# Patient Record
Sex: Female | Born: 1945
Health system: Southern US, Community
[De-identification: ages and names within clinical notes are randomized; demographics above are authoritative.]

## PROBLEM LIST (undated history)

## (undated) DIAGNOSIS — R519 Headache, unspecified: Secondary | ICD-10-CM

## (undated) DIAGNOSIS — M51369 Other intervertebral disc degeneration, lumbar region without mention of lumbar back pain or lower extremity pain: Secondary | ICD-10-CM

## (undated) DIAGNOSIS — Z531 Procedure and treatment not carried out because of patient's decision for reasons of belief and group pressure: Secondary | ICD-10-CM

## (undated) DIAGNOSIS — E039 Hypothyroidism, unspecified: Secondary | ICD-10-CM

## (undated) DIAGNOSIS — M1990 Unspecified osteoarthritis, unspecified site: Secondary | ICD-10-CM

## (undated) DIAGNOSIS — R51 Headache: Secondary | ICD-10-CM

## (undated) DIAGNOSIS — IMO0001 Reserved for inherently not codable concepts without codable children: Secondary | ICD-10-CM

## (undated) DIAGNOSIS — Z9889 Other specified postprocedural states: Secondary | ICD-10-CM

## (undated) DIAGNOSIS — I3139 Other pericardial effusion (noninflammatory): Secondary | ICD-10-CM

## (undated) DIAGNOSIS — I313 Pericardial effusion (noninflammatory): Secondary | ICD-10-CM

## (undated) DIAGNOSIS — M5136 Other intervertebral disc degeneration, lumbar region: Secondary | ICD-10-CM

## (undated) HISTORY — PX: REPAIR NONUNION / DEFECT OF RADIUS / ULNA: SUR1192

## (undated) HISTORY — DX: Other specified postprocedural states: Z98.890

## (undated) HISTORY — PX: DILATION AND CURETTAGE OF UTERUS: SHX78

---

## 2003-01-20 ENCOUNTER — Ambulatory Visit (HOSPITAL_COMMUNITY): Admission: RE | Admit: 2003-01-20 | Discharge: 2003-01-20 | Payer: Self-pay | Admitting: Oral and Maxillofacial Surgery

## 2005-01-27 ENCOUNTER — Ambulatory Visit: Payer: Self-pay

## 2005-11-10 ENCOUNTER — Ambulatory Visit: Payer: Self-pay | Admitting: Family Medicine

## 2006-01-23 ENCOUNTER — Ambulatory Visit: Payer: Self-pay

## 2006-08-17 ENCOUNTER — Ambulatory Visit: Payer: Self-pay

## 2007-09-09 ENCOUNTER — Ambulatory Visit: Payer: Self-pay

## 2009-04-23 ENCOUNTER — Ambulatory Visit: Payer: Self-pay | Admitting: Family Medicine

## 2009-04-23 DIAGNOSIS — R5381 Other malaise: Secondary | ICD-10-CM

## 2009-04-23 DIAGNOSIS — E039 Hypothyroidism, unspecified: Secondary | ICD-10-CM

## 2009-04-23 DIAGNOSIS — G43109 Migraine with aura, not intractable, without status migrainosus: Secondary | ICD-10-CM | POA: Insufficient documentation

## 2009-04-23 DIAGNOSIS — R5383 Other fatigue: Secondary | ICD-10-CM

## 2009-04-23 DIAGNOSIS — M810 Age-related osteoporosis without current pathological fracture: Secondary | ICD-10-CM | POA: Insufficient documentation

## 2009-04-23 DIAGNOSIS — M8580 Other specified disorders of bone density and structure, unspecified site: Secondary | ICD-10-CM

## 2009-04-23 DIAGNOSIS — M1990 Unspecified osteoarthritis, unspecified site: Secondary | ICD-10-CM | POA: Insufficient documentation

## 2009-04-26 LAB — CONVERTED CEMR LAB: TSH: 1.75 microintl units/mL (ref 0.35–5.50)

## 2009-05-28 ENCOUNTER — Ambulatory Visit: Payer: Self-pay | Admitting: Family Medicine

## 2009-05-31 LAB — CONVERTED CEMR LAB
ALT: 14 units/L (ref 0–35)
AST: 17 units/L (ref 0–37)
Albumin: 4 g/dL (ref 3.5–5.2)
Alkaline Phosphatase: 38 units/L — ABNORMAL LOW (ref 39–117)
BUN: 14 mg/dL (ref 6–23)
Basophils Absolute: 0 10*3/uL (ref 0.0–0.1)
Basophils Relative: 0.6 % (ref 0.0–3.0)
Bilirubin, Direct: 0.1 mg/dL (ref 0.0–0.3)
CO2: 29 meq/L (ref 19–32)
Calcium: 8.9 mg/dL (ref 8.4–10.5)
Chloride: 110 meq/L (ref 96–112)
Cholesterol: 194 mg/dL (ref 0–200)
Creatinine, Ser: 0.9 mg/dL (ref 0.4–1.2)
Eosinophils Absolute: 0.2 10*3/uL (ref 0.0–0.7)
Eosinophils Relative: 3.9 % (ref 0.0–5.0)
GFR calc non Af Amer: 67.41 mL/min (ref >60)
Glucose, Bld: 87 mg/dL (ref 70–99)
HCT: 38.9 % (ref 36.0–46.0)
HDL: 72.1 mg/dL (ref >39.00)
Hemoglobin: 13.1 g/dL (ref 12.0–15.0)
LDL Cholesterol: 110 mg/dL — ABNORMAL HIGH (ref 0–99)
Lymphocytes Relative: 34.2 % (ref 12.0–46.0)
Lymphs Abs: 1.7 10*3/uL (ref 0.7–4.0)
MCHC: 33.5 g/dL (ref 30.0–36.0)
MCV: 92.2 fL (ref 78.0–100.0)
Monocytes Absolute: 0.4 10*3/uL (ref 0.1–1.0)
Monocytes Relative: 8.3 % (ref 3.0–12.0)
Neutro Abs: 2.7 10*3/uL (ref 1.4–7.7)
Neutrophils Relative %: 53 % (ref 43.0–77.0)
Platelets: 251 10*3/uL (ref 150.0–400.0)
Potassium: 3.8 meq/L (ref 3.5–5.1)
RBC: 4.22 M/uL (ref 3.87–5.11)
RDW: 11.9 % (ref 11.5–14.6)
Sodium: 141 meq/L (ref 135–145)
Total Bilirubin: 0.6 mg/dL (ref 0.3–1.2)
Total CHOL/HDL Ratio: 3
Total Protein: 6.7 g/dL (ref 6.0–8.3)
Triglycerides: 62 mg/dL (ref 0.0–149.0)
VLDL: 12.4 mg/dL (ref 0.0–40.0)
Vit D, 25-Hydroxy: 34 ng/mL (ref 30–89)
Vitamin B-12: 328 pg/mL (ref 211–911)
WBC: 5 10*3/uL (ref 4.5–10.5)

## 2009-06-01 ENCOUNTER — Other Ambulatory Visit: Admission: RE | Admit: 2009-06-01 | Discharge: 2009-06-01 | Payer: Self-pay | Admitting: Family Medicine

## 2009-06-01 ENCOUNTER — Ambulatory Visit: Payer: Self-pay | Admitting: Family Medicine

## 2009-06-01 LAB — HM PAP SMEAR

## 2009-06-01 LAB — CONVERTED CEMR LAB: Pap Smear: NORMAL

## 2009-06-02 ENCOUNTER — Encounter: Payer: Self-pay | Admitting: Family Medicine

## 2009-06-02 ENCOUNTER — Ambulatory Visit: Payer: Self-pay | Admitting: Family Medicine

## 2009-06-03 ENCOUNTER — Ambulatory Visit: Payer: Self-pay | Admitting: Family Medicine

## 2009-06-03 ENCOUNTER — Encounter: Payer: Self-pay | Admitting: Family Medicine

## 2009-06-04 ENCOUNTER — Encounter: Payer: Self-pay | Admitting: Family Medicine

## 2009-06-04 LAB — CONVERTED CEMR LAB: Pap Smear: NEGATIVE

## 2009-06-08 LAB — HM MAMMOGRAPHY: HM Mammogram: NORMAL

## 2009-06-09 ENCOUNTER — Encounter (INDEPENDENT_AMBULATORY_CARE_PROVIDER_SITE_OTHER): Payer: Self-pay | Admitting: *Deleted

## 2009-09-09 ENCOUNTER — Encounter (INDEPENDENT_AMBULATORY_CARE_PROVIDER_SITE_OTHER): Payer: Self-pay | Admitting: *Deleted

## 2009-09-14 ENCOUNTER — Telehealth: Payer: Self-pay | Admitting: Family Medicine

## 2009-09-15 ENCOUNTER — Telehealth: Payer: Self-pay | Admitting: Family Medicine

## 2010-04-21 NOTE — Progress Notes (Signed)
Summary: No response to GI referral.  Phone Note Other Incoming   Summary of Call: Pt unresponsive to call and Ltr regarding GI referral...fyi to Phyician.Daine Gip  September 14, 2009 12:08 PM  Initial call taken by: Daine Gip,  September 14, 2009 12:08 PM

## 2010-04-21 NOTE — Letter (Signed)
Summary: Results Follow up Letter  [place] at [facility]  [address] [place], [place] [address]   Phone: [phone]  Fax: [phone]    06/09/2009 MRN: [id number]     [facility] [address] [place], [place]  [address]    Dear Ms. Pasion,  The following are the results of your recent test(s):  Test         Result    Pap Smear:        Normal _____  Not Normal _____ Comments: ______________________________________________________ Cholesterol: LDL(Bad cholesterol):         Your goal is less than:         HDL (Good cholesterol):       Your goal is more than: Comments:  ______________________________________________________ Mammogram:        Normal ___x__  Not Normal _____ Comments: repeat in 1 year  ___________________________________________________________________ Hemoccult:        Normal _____  Not normal _______ Comments:    _____________________________________________________________________ Other Tests:    We routinely do not discuss normal results over the telephone.  If you desire a copy of the results, or you have any questions about this information we can discuss them at your next office visit.   Sincerely,

## 2010-04-21 NOTE — Assessment & Plan Note (Signed)
Summary: PT TO RE-ESTABLISH,CHECK THYROID   Vital Signs:  Patient profile:   65 year old female Weight:      164.13 pounds Temp:     98.7 degrees F oral Pulse rate:   80 / minute Pulse rhythm:   regular BP sitting:   112 / 82  (left arm) Cuff size:   regular  Vitals Entered By: Linde Gillis CMA Duncan Dull) (April 23, 2009 10:07 AM) CC: new patient/restablish care   History of Present Illness: Has not seen MD in 3 years..no specific issues.   Works for an endocrinologist ...they have been checking this.Marland Kitchendue for refills.   Feels in last 3-4 week..very tired at end of day.  Not really well rested in AM.  No AM headaches. No snoring. Urinates 2 times at night..not really adequate sleep.  Urinates every few hours during the day. Drinks water all night!   Allergies (verified): 1)  ! * Wool 2)  Sulfa  Past History:  Past Surgical History: D and C Right rad/ulna repair  Family History: Father deceased at age 72 from flu, pneumonia, and sepsis. Mother deceased at age 74 with CVA, MI, and ? Parkinson disease.  She has 2 brothers with 1 who passed away in the past several years with small cell lung cancer, and she also has 2 sisters who are healthy.  There is a strong family history of coronary artery disease, high blood pressure, and her grandparents had diabetes.  There is also breast cancer in her maternal grandmother and aunt. Niece with bipolar disorder.  She denies other types of cancer.   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  She is divorced and is not currently sexually active. She does have 3 kids and 7 grandchildren.  She does not get any regular exercise.  She does skip breakfast daily and eats a small amount with frequent snacks.  She drinks lots of water and eats a large amount of fruits and vegetables.  She was a former smoker with a 6-pack year history but quitover 17 years ago.  She drinks about 4 drinks every  3-4 months.  She denies any past use of any illegal drugs.   Review of Systems General:  Complains of fatigue; denies fever. CV:  Denies chest pain or discomfort. Resp:  Denies shortness of breath. GI:  Denies abdominal pain, bloody stools, constipation, and diarrhea. GU:  Denies dysuria.  Physical Exam  General:  Well-developed,well-nourished,in no acute distress; alert,appropriate and cooperative throughout examination Eyes:  No corneal or conjunctival inflammation noted. EOMI. Perrla. Funduscopic exam benign, without hemorrhages, exudates or papilledema. Vision grossly normal. Ears:  External ear exam shows no significant lesions or deformities.  Otoscopic examination reveals clear canals, tympanic membranes are intact bilaterally without bulging, retraction, inflammation or discharge. Hearing is grossly normal bilaterally. Nose:  External nasal examination shows no deformity or inflammation. Nasal mucosa are pink and moist without lesions or exudates. Mouth:  MMM Neck:  No deformities, masses, or tenderness noted. no cervical or supraclavicular lymphadenopathy  Lungs:  Normal respiratory effort, chest expands symmetrically. Lungs are clear to auscultation, no crackles or wheezes. Heart:  Normal rate and regular rhythm. S1 and S2 normal without gallop, murmur, click, rub or other extra sounds. Abdomen:  Bowel sounds positive,abdomen soft and non-tender without masses, organomegaly or hernias noted. Msk:  No deformity or scoliosis noted of thoracic or lumbar spine.   Pulses:  R and L posterior tibial pulses are full  and equal bilaterally  Extremities:  no edema  Neurologic:  No cranial nerve deficits noted. Station and gait are normal.  Sensory, motor and coordinative functions appear intact. Skin:  Intact without suspicious lesions or rashes Psych:  Cognition and judgment appear intact. Alert and cooperative with normal attention span and concentration. No apparent delusions, illusions,  hallucinations   Impression & Recommendations:  Problem # 1:  UNSPECIFIED HYPOTHYROIDISM (ICD-244.9) Due for reeval. Hold refill until labs back. Her updated medication list for this problem includes:    Levothyroxine Sodium 125 Mcg Tabs (Levothyroxine sodium) .Marland Kitchen... Take one tablet by mouth once daily  Orders: TLB-TSH (Thyroid Stimulating Hormone) (84443-TSH)  Problem # 2:  FATIGUE, CHRONIC (ICD-780.79) Will eval with labs for vit def, other cause. ?due to frequant urination and poor sleep. Gave info on sleep hygeine. Will follow up at upcoming CPX.   Complete Medication List: 1)  Levothyroxine Sodium 125 Mcg Tabs (Levothyroxine sodium) .... Take one tablet by mouth once daily  Patient Instructions: 1)  Fasting  lipids, CMET Dx v75.91, cbc, vit B12, vit D Dx 789.90 2)  Schedule CPX in next  month.  Current Allergies (reviewed today): ! * WOOL SULFA  Flu Vaccine Result Date:  01/18/2009 Flu Vaccine Result:  given Flu Vaccine Next Due:  1 yr Flex Sig Next Due:  Not Indicated    Past Surgical History:    D and C    Right rad/ulna repair

## 2010-04-21 NOTE — Miscellaneous (Signed)
Summary: Vaccine Record/[facility]  Vaccine Record/[facility]   Imported By: Lanelle Bal 06/02/2009 13:45:01  _____________________________________________________________________  External Attachment:    Type:   Image     Comment:   External Document  Appended Document: Orders Update    Clinical Lists Changes  Observations: Added new observation of TDBOOSTDUE: 12/03/2017 (06/02/2009 14:08) Added new observation of TD BOOSTER: given (12/04/2007 14:32) Added new observation of TD BOOSTER: given (12/04/2007 14:08)      TD Result Date:  12/04/2007 TD Result:  given TD Next Due:  10 yr Notify pt vaccine records reviewed..up to date with tetanus..due in 2019 for repeat. Kerby Nora MD  June 02, 2009 2:09 PM  Patient advised.Consuello Masse CMA

## 2010-04-21 NOTE — Letter (Signed)
Summary: Results Follow up Letter  [place] at [facility]  [address] [place], [place] [address]   Phone: [phone]  Fax: [phone]    06/04/2009 MRN: [id number]  [facility] [address] [place], [place]  [address]  Dear Ms. Carel,  The following are the results of your recent test(s):  Test         Result    Pap Smear:        Normal __X___  Not Normal _____ Comments: Please repeat in one year. ______________________________________________________ Cholesterol: LDL(Bad cholesterol):         Your goal is less than:         HDL (Good cholesterol):       Your goal is more than: Comments:  ______________________________________________________ Mammogram:        Normal _____  Not Normal _____ Comments:  ___________________________________________________________________ Hemoccult:        Normal _____  Not normal _______ Comments:    _____________________________________________________________________ Other Tests:    We routinely do not discuss normal results over the telephone.  If you desire a copy of the results, or you have any questions about this information we can discuss them at your next office visit.   Sincerely,       Kerby Nora, MD

## 2010-04-21 NOTE — Progress Notes (Signed)
Summary: wants weight loss pill  Phone Note Call from Patient Call back at Home Phone [phone]   Caller: Patient Call For: Kerby Nora MD Summary of Call: Pt says she is on the state health plan and her insurance plan charges her extra for being over weight.  She has been trying to lose weight but still has about 25-30 lbs to lose.  She is asking for a weight loss pill to help her with this.  Uses [facility] [address]. Initial call taken by: Lowella Petties CMA,  September 15, 2009 3:49 PM  Follow-up for Phone Call        She can use OTC ALLI for weight loss. I do not prescribe stimulant medication due to cardiac side effects and temporary results. If she wants second opinion she may go to weight loss center in area for stimulant medication.  Follow-up by: Kerby Nora MD,  September 17, 2009 1:51 PM  Additional Follow-up for Phone Call Additional follow up Details #1::        patient advised.Consuello Masse CMA   Additional Follow-up by: Benny Lennert CMA Duncan Dull),  September 17, 2009 2:05 PM

## 2010-04-21 NOTE — Letter (Signed)
Summary: Unable To Reach-Consult Scheduled  [place] at [facility]  [address] [place], [place] [address]   Phone: [phone]  Fax: [phone]    09/09/2009 MRN: [id number]    Dear Ms. Sapien,   We have been unable to reach you by phone.  Please contact our office with an updated phone number.  At the recommendation of Dr.Amy Bedsole, we have been asked to schedule you a consult with Gastroenterology. If you wish to decline this appointment, please call our office at [phone], and let us know.  If you have any question please call us.     Thank you, Aram Beecham  [phone]  Patient Care Coordinator [place] at [facility]

## 2010-04-21 NOTE — Assessment & Plan Note (Signed)
Summary: CPX PER MD/[facility]   Vital Signs:  Patient profile:   65 year old female Height:      66 inches Weight:      165.6 pounds BMI:     26.83 Temp:     98.0 degrees F oral Pulse rate:   80 / minute Pulse rhythm:   regular BP sitting:   120 / 70  (left arm) Cuff size:   regular  Vitals Entered By: Benny Lennert CMA Duncan Dull) (June 01, 2009 4:02 PM)  History of Present Illness: Chief complaint cpx   The patient is here for annual wellness exam and preventative care.      LAst bone density done in 1996.Marland Kitchenosteoporosis.   Problems Prior to Update: 1)  Other Osteoporosis  (ICD-733.09) 2)  Osteoarthritis, Moderate  (ICD-715.90) 3)  Fatigue, Chronic  (ICD-780.79) 4)  Migraine With Aura  (ICD-346.00) 5)  Unspecified Hypothyroidism  (ICD-244.9)  Current Medications (verified): 1)  Levothyroxine Sodium 125 Mcg Tabs (Levothyroxine Sodium) .... Take One Tablet By Mouth Once Daily  Allergies: 1)  ! * Wool 2)  Sulfa  Past History:  Past medical, surgical, family and social histories (including risk factors) reviewed, and no changes noted (except as noted below).  Past Surgical History: Reviewed history from 04/23/2009 and no changes required. D and C Right rad/ulna repair  Family History: Reviewed history from 04/23/2009 and no changes required. Father deceased at age 100 from flu, pneumonia, and sepsis. Mother deceased at age 66 with CVA, MI age 45s, and ? Parkinson disease.  She has 2 brothers with 1 who passed away in the past several years with small cell lung cancer, and she also has 2 sisters who are healthy.  There is a strong family history of coronary artery disease, high blood pressure, and her grandparents had diabetes.  There is also breast cancer in her maternal grandmother and aunt. Niece with bipolar disorder.  She denies other types of cancer.   Social History: Reviewed history from 04/23/2009 and no changes required. 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  She is divorced and is not currently sexually active. She does have 3 kids and 7 grandchildren.  She does not get any regular exercise.  She does skip breakfast daily and eats a small amount with frequent snacks.  She drinks lots of water and eats a large amount of fruits and vegetables.  She was a former smoker with a 6-pack year history but quitover 17 years ago.  She drinks about 4 drinks every 3-4 months.  She denies any past use of any illegal drugs.   Review of Systems General:  Complains of fatigue; denies fever. CV:  Denies chest pain or discomfort. Resp:  Denies shortness of breath. GI:  Denies abdominal pain. GU:  Denies abnormal vaginal bleeding, dysuria, nocturia, urinary frequency, and urinary hesitancy. Psych:  Denies anxiety and depression; sleep is improved.  Physical Exam  General:  Well-developed,well-nourished,in no acute distress; alert,appropriate and cooperative throughout examination Eyes:  No corneal or conjunctival inflammation noted. EOMI. Perrla. Funduscopic exam benign, without hemorrhages, exudates or papilledema. Vision grossly normal. Ears:  External ear exam shows no significant lesions or deformities.  Otoscopic examination reveals clear canals, tympanic membranes are intact bilaterally without bulging, retraction, inflammation or discharge. Hearing is grossly normal bilaterally. Nose:  External nasal examination shows no deformity or inflammation. Nasal mucosa are pink and moist without lesions or exudates. Mouth:  Oral mucosa and oropharynx without lesions or exudates.  Teeth in good repair. Neck:  no carotid bruit or thyromegaly no cervical or supraclavicular lymphadenopathy   Chest Wall:  No deformities, masses, or tenderness noted. Breasts:  No mass, nodules, thickening, tenderness, bulging, retraction, inflamation, nipple discharge or skin changes noted.   Lungs:  Normal respiratory effort, chest expands symmetrically.  Lungs are clear to auscultation, no crackles or wheezes. Heart:  Normal rate and regular rhythm. S1 and S2 normal without gallop, murmur, click, rub or other extra sounds. Abdomen:  Bowel sounds positive,abdomen soft and non-tender without masses, organomegaly or hernias noted. Genitalia:  Pelvic Exam:        External: normal female genitalia without lesions or masses        Vagina: normal without lesions or masses        Cervix: normal without lesions or masses        Adnexa: normal bimanual exam without masses or fullness        Uterus: normal by palpation        Pap smear: performed Msk:  No deformity or scoliosis noted of thoracic or lumbar spine.   Pulses:  R and L posterior tibial pulses are full and equal bilaterally  Extremities:  no edema  Neurologic:  No cranial nerve deficits noted. Station and gait are normal.  Sensory, motor and coordinative functions appear intact. Skin:  Intact without suspicious lesions or rashes Psych:  Cognition and judgment appear intact. Alert and cooperative with normal attention span and concentration. No apparent delusions, illusions, hallucinations   Impression & Recommendations:  Problem # 1:  Preventive Health Care (ICD-V70.0) The patient's preventative maintenance and recommended screening tests for an annual wellness exam were reviewed in full today. Brought up to date unless services declined.  Counselled on the importance of diet, exercise, and its role in overall health and mortality. The patient's FH and SH was reviewed, including their home life, tobacco status, and drug and alcohol status.     Problem # 2:  FATIGUE, CHRONIC (ICD-780.79) Eval with labs negative. Recommend better sleep hygiene, regualr exercise. Follow up if not imrpoving.   Complete Medication List: 1)  Levothyroxine Sodium 125 Mcg Tabs (Levothyroxine sodium) .... Take one tablet by mouth once daily  Other Orders: Radiology Referral (Radiology) Radiology Referral  (Radiology) Gastroenterology Referral (GI)  Patient Instructions: 1)  B12 1000 mg daily 2)  Calcium and vit D 600mg /400IU two times a day  3)  Referral Appointment Information 4)  Day/Date: 5)  Time: 6)  Place/MD: 7)  Address: 8)  Phone/Fax: 9)  Patient given appointment information. Information/Orders faxed/mailed.  10)  Call insurance about shingles vaccine.  11)  Start regular exercsie program.  12)  Please schedule a follow-up appointment in 1 month earlier if fatigue not improving.   Current Allergies (reviewed today): ! * WOOL SULFA   Past Surgical History:    Reviewed history from 04/23/2009 and no changes required:       D and C       Right rad/ulna repair

## 2010-05-14 ENCOUNTER — Encounter: Payer: Self-pay | Admitting: Family Medicine

## 2010-05-24 ENCOUNTER — Encounter: Payer: Self-pay | Admitting: Family Medicine

## 2010-06-10 ENCOUNTER — Telehealth: Payer: Self-pay | Admitting: Family Medicine

## 2010-06-10 DIAGNOSIS — Z13220 Encounter for screening for lipoid disorders: Secondary | ICD-10-CM | POA: Insufficient documentation

## 2010-06-10 DIAGNOSIS — M899 Disorder of bone, unspecified: Secondary | ICD-10-CM

## 2010-06-10 DIAGNOSIS — E039 Hypothyroidism, unspecified: Secondary | ICD-10-CM

## 2010-06-10 NOTE — Telephone Encounter (Signed)
Message copied by Kerby Nora on Fri Jun 10, 2010  5:03 PM ------      Message from: Margarite Gouge, NATASHA      Created: Fri Jun 10, 2010 11:01 AM      Regarding: CPX labs Mon       Please order  future cpx labs for pt's upcomming lab appt.            Thanks      Rodney Booze

## 2010-06-13 ENCOUNTER — Other Ambulatory Visit (INDEPENDENT_AMBULATORY_CARE_PROVIDER_SITE_OTHER): Payer: BC Managed Care – PPO | Admitting: Family Medicine

## 2010-06-13 DIAGNOSIS — M899 Disorder of bone, unspecified: Secondary | ICD-10-CM

## 2010-06-13 DIAGNOSIS — E785 Hyperlipidemia, unspecified: Secondary | ICD-10-CM

## 2010-06-13 DIAGNOSIS — M949 Disorder of cartilage, unspecified: Secondary | ICD-10-CM

## 2010-06-13 DIAGNOSIS — Z13220 Encounter for screening for lipoid disorders: Secondary | ICD-10-CM

## 2010-06-13 DIAGNOSIS — E039 Hypothyroidism, unspecified: Secondary | ICD-10-CM

## 2010-06-13 LAB — COMPREHENSIVE METABOLIC PANEL
ALT: 18 U/L (ref 0–35)
AST: 20 U/L (ref 0–37)
Albumin: 3.6 g/dL (ref 3.5–5.2)
Alkaline Phosphatase: 48 U/L (ref 39–117)
BUN: 17 mg/dL (ref 6–23)
CO2: 28 mEq/L (ref 19–32)
Calcium: 8.8 mg/dL (ref 8.4–10.5)
Chloride: 107 mEq/L (ref 96–112)
Creatinine, Ser: 1 mg/dL (ref 0.4–1.2)
GFR: 62.36 mL/min (ref >60.00)
Glucose, Bld: 59 mg/dL — ABNORMAL LOW (ref 70–99)
Potassium: 3.8 mEq/L (ref 3.5–5.1)
Sodium: 140 mEq/L (ref 135–145)
Total Bilirubin: 0.3 mg/dL (ref 0.3–1.2)
Total Protein: 6.2 g/dL (ref 6.0–8.3)

## 2010-06-13 LAB — LDL CHOLESTEROL, DIRECT: Direct LDL: 153.4 mg/dL

## 2010-06-13 LAB — LIPID PANEL
Cholesterol: 224 mg/dL — ABNORMAL HIGH (ref 0–200)
HDL: 58.8 mg/dL (ref >39.00)
Total CHOL/HDL Ratio: 4
Triglycerides: 143 mg/dL (ref 0.0–149.0)
VLDL: 28.6 mg/dL (ref 0.0–40.0)

## 2010-06-13 LAB — TSH: TSH: 1.09 u[IU]/mL (ref 0.35–5.50)

## 2010-06-14 LAB — VITAMIN D 25 HYDROXY (VIT D DEFICIENCY, FRACTURES): Vit D, 25-Hydroxy: 31 ng/mL (ref 30–89)

## 2010-06-15 NOTE — Progress Notes (Signed)
Patient advised via message on machine 

## 2010-06-20 ENCOUNTER — Encounter: Payer: Self-pay | Admitting: Family Medicine

## 2010-06-27 ENCOUNTER — Encounter: Payer: Self-pay | Admitting: Family Medicine

## 2010-07-11 ENCOUNTER — Encounter: Payer: Self-pay | Admitting: Family Medicine

## 2010-07-11 ENCOUNTER — Ambulatory Visit (INDEPENDENT_AMBULATORY_CARE_PROVIDER_SITE_OTHER): Payer: BC Managed Care – PPO | Admitting: Family Medicine

## 2010-07-11 ENCOUNTER — Ambulatory Visit: Payer: Self-pay | Admitting: Family Medicine

## 2010-07-11 DIAGNOSIS — M899 Disorder of bone, unspecified: Secondary | ICD-10-CM

## 2010-07-11 DIAGNOSIS — Z0000 Encounter for general adult medical examination without abnormal findings: Secondary | ICD-10-CM

## 2010-07-11 DIAGNOSIS — Z1211 Encounter for screening for malignant neoplasm of colon: Secondary | ICD-10-CM

## 2010-07-11 DIAGNOSIS — E039 Hypothyroidism, unspecified: Secondary | ICD-10-CM

## 2010-07-11 DIAGNOSIS — M949 Disorder of cartilage, unspecified: Secondary | ICD-10-CM

## 2010-07-11 DIAGNOSIS — E7800 Pure hypercholesterolemia, unspecified: Secondary | ICD-10-CM

## 2010-07-11 DIAGNOSIS — Z1231 Encounter for screening mammogram for malignant neoplasm of breast: Secondary | ICD-10-CM

## 2010-07-11 NOTE — Assessment & Plan Note (Signed)
LAST DXA 1 year ago. Repeat in 1 year.  On ca and vit D, vit D in nml range. Working on [employer] bearing exercise.

## 2010-07-11 NOTE — Assessment & Plan Note (Addendum)
LDL worsened control. Info given about lifestyle and diet  changes. Encouraged exercise, weight loss, healthy eating habits.  Recheck in 1 year.

## 2010-07-11 NOTE — Assessment & Plan Note (Signed)
Well controlled. Continue current medication.  

## 2010-07-11 NOTE — Progress Notes (Signed)
Subjective:    Patient ID: Stephanie Kemp, female    DOB: 11-Jan-1946, 65 y.o.   MRN: [id number]  HPI 65 year old female here for annual wellness exam and preventative care.    She has the following acute and/or chronic issues.  Hypothyroid: stable TSH on current dose of meds.  Screening labs reviewed in detail.. No high chol or DM. Vit D in nml range.  Some continued fatigue.. Work up negative. Exercising: walking daily 30-1 hour. Diet: fruits and veggies.Marland Kitchenworking on weight loss. Has gained 15 lbs in last few years...plans to go back to weight watchers.     Review of Systems  Constitutional: Positive for fatigue. Negative for fever and unexpected weight change.  HENT: Negative for ear pain, congestion, sore throat, sneezing, trouble swallowing and sinus pressure.   Eyes: Negative for pain and itching.  Respiratory: Negative for cough, shortness of breath and wheezing.   Cardiovascular: Negative for chest pain, palpitations and leg swelling.  Gastrointestinal: Negative for nausea, abdominal pain, diarrhea, constipation and blood in stool.  Genitourinary: Negative for dysuria, hematuria, vaginal discharge, difficulty urinating and menstrual problem.  Skin: Negative for rash.  Neurological: Negative for syncope, weakness, light-headedness, numbness and headaches.  Psychiatric/Behavioral: Negative for confusion and dysphoric mood. The patient is not nervous/anxious.        Objective:   Physical Exam  Constitutional: Vital signs are normal. She appears well-developed and well-nourished. She is cooperative.  Non-toxic appearance. She does not appear ill. No distress.  HENT:  Head: Normocephalic.  Right Ear: Hearing, tympanic membrane, external ear and ear canal normal.  Left Ear: Hearing, tympanic membrane, external ear and ear canal normal.  Nose: Nose normal.  Eyes: Conjunctivae, EOM and lids are normal. Pupils are equal, round, and reactive to light. No foreign bodies found.   Neck: Trachea normal and normal range of motion. Neck supple. Carotid bruit is not present. No mass and no thyromegaly present.  Cardiovascular: Normal rate, regular rhythm, S1 normal, S2 normal, normal heart sounds and intact distal pulses.  Exam reveals no gallop.   No murmur heard. Pulmonary/Chest: Effort normal and breath sounds normal. No respiratory distress. She has no wheezes. She has no rhonchi. She has no rales.  Abdominal: Soft. Normal appearance and bowel sounds are normal. She exhibits no distension, no fluid wave, no abdominal bruit and no mass. There is no hepatosplenomegaly. There is no tenderness. There is no rebound, no guarding and no CVA tenderness. No hernia.  Genitourinary: Rectum normal, vagina normal and uterus normal. No breast swelling, tenderness, discharge or bleeding. Pelvic exam was performed with patient prone. There is no rash, tenderness or lesion on the right labia. There is no rash, tenderness or lesion on the left labia. Uterus is not enlarged and not tender. Right adnexum displays no mass, no tenderness and no fullness. Left adnexum displays no mass, no tenderness and no fullness.       No pap performed  Lymphadenopathy:    She has no cervical adenopathy.    She has no axillary adenopathy.  Neurological: She is alert. She has normal strength. No cranial nerve deficit or sensory deficit.  Skin: Skin is warm, dry and intact. No rash noted.  Psychiatric: Her speech is normal and behavior is normal. Judgment normal. Her mood appears not anxious. Cognition and memory are normal. She does not exhibit a depressed mood.          Assessment & Plan:  Complete Physical Exam: The patient's preventative maintenance  and recommended screening tests for an annual wellness exam were reviewed in full today. Brought up to date unless services declined.  Counselled on the importance of diet, exercise, and its role in overall health and mortality. The patient's FH and SH  was reviewed, including their home life, tobacco status, and drug and alcohol status.

## 2010-07-11 NOTE — Patient Instructions (Addendum)
Look into shingles vaccine. Stop at front desk to speak with Shirlee Limerick about colonoscopy and mammogram. Work on low cholesterol diet and weight loss. Increase exercise.

## 2010-08-05 NOTE — Assessment & Plan Note (Signed)
[facility]                             [facility] OFFICE NOTE   NAME:Stephanie Kemp                      MRN:          [id number]  DATE:11/10/2005                            DOB:          November 04, 1945    CHIEF COMPLAINT:  A 65 year old white female here to establish a new doctor.   HISTORY OF PRESENT ILLNESS:  [name] has been having problems over the  past several years with fatigue.  The fatigue had gradual onset.  She feels  that ever since she was diagnosed with hypothyroidism she has had problems.  She was diagnosed in 1996.  She is very frustrated with the fatigue and is  unable to do things that she finds fun.  She denies any insomnia, slow  thinking, excessive guilt, suicidal ideation, or depressed mood.  She is  very concerned though about weight gain over the past few years.  She states  that she is very hungry throughout the day.  She denies any unexpected  weight loss, night sweats, fever, chills.  She does have some stress from  the fact that her son is in [place] and that causes her occasional anxiety  but this is not too much of a problem for her.   PAST MEDICAL HISTORY:  1. Hypothyroidism.  2. History of pericardial effusion.  3. Migraines without aura.  4. Osteoarthritis.  5. Osteoporosis.   HOSPITALIZATION SURGERIES/PROCEDURES:  1. In 2003, echocardiogram.  2. In 2000, Dexascan.  3. In 2001, normal mammogram.  4. In 2001, normal Pap smear.   ALLERGIES:  SULFA CAUSES RASH.   MEDICATIONS:  1. Levothyroxine 125 mcg daily.  2. Iron over-the-counter.  3. Glucosamine chondroitin.  4. Potassium 100 mEq p.o. daily.  5. Calcium and vitamin D 1500 mg to 2000 mg daily.  6. __________  470 mg daily.  7. Ex-Lax daily.   REVIEW OF SYSTEMS:  She denies dyspnea, chest pain, palpitations, cough,  nausea, vomiting, or diarrhea.  She does have constipation and requires Ex-  Lax daily.  She denies any joint pain and aches that  have not improved with  glucosamine.   FAMILY HISTORY:  Father deceased at age 53 from flu, pneumonia, and sepsis.  Mother deceased at age 59 with CVA, MI, and Parkinson disease.  She has 2  brothers with 1 who passed away in the past several years with small cell  lung cancer, and she also has 2 sisters who are healthy.  There is a strong  family history of coronary artery disease, high blood pressure, and her  grandparents had diabetes.  There is also breast cancer in her maternal  grandmother and aunt and a niece with bipolar disorder.  She denies other  types of cancer.   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  She is divorced and is not currently sexually active.  She does have 3 kids and 7 grandchildren.  She does not get any regular  exercise.  She does skip breakfast daily and eats a small amount with  frequent snacks.  She drinks lots of water and eats a large amount of fruits  and vegetables.  She was a former smoker with a 6-pack year history but quit  over 17 years ago.  She drinks about 4 drinks every 3-4 months.  She denies  any past use of any illegal drugs.   PHYSICAL EXAMINATION:  VITAL SIGNS:  Height 65-1/4 inches, weight 85.6,  making BMI 29, blood pressure 108/86, pulse 80, temperature 98.1.  GENERAL:  Over weight appearing female in no apparent distress.  HEENT:  PERRLA.  Extraocular muscles intact.  No papilledema.  Oropharynx  clear.  Tympanic membranes clear.  No thyromegaly.  No lymphadenopathy  cervical or supraclavicular.  PULMONARY:  Clear to auscultation bilaterally.  No wheezes, rales, or  rhonchi.  No cyanosis or clubbing.  CARDIOVASCULAR:  Regular rate and rhythm.  No murmurs, rubs, or gallops.  Pulses 2+ peripherally.  No peripheral edema.  ABDOMEN:  Soft, nontender, normoactive bowel sounds.  No hepatosplenomegaly.  MUSCULOSKELETAL:  Strength 5/5 in upper and lower extremities.  NEUROLOGIC:   Cranial nerves II-XII grossly intact.  Sensation to touch  intact in upper and lower extremities.  Reflexes 2+.  PSYCHIATRIC:  Appropriate affect.  Denies suicidal or homicidal ideation.   ASSESSMENT/PLAN:  1. Fatigue, chronic.  I will obtain records from her previous doctor to      determine if any previous workup has been done.  It is unclear as to      what the previous cause of pericarditis was and whether this was      connected with her fatigue symptoms.  Her thyroid hormone, she states      when last checked was in good control and so is unlikely to be the      cause of her fatigue.  She does not appear to be clinically depressed.      We can consider further laboratory workup and evaluation after records      are obtained.  2. Weight gain.  We discussed healthy eating habits and ways to increase      her level of exercise.  She states that she cannot exercise because of      the fatigue.  At our next appointment, we can consider discussing      __________ or orlistat.  She will return following the receipt of her      records, after we give her a call.                                   Kerby Nora, MD   AB/MedQ  DD:  11/10/2005  DT:  11/10/2005  Job #:  [id number]

## 2010-08-10 ENCOUNTER — Encounter: Payer: Self-pay | Admitting: Family Medicine

## 2011-02-07 ENCOUNTER — Ambulatory Visit (INDEPENDENT_AMBULATORY_CARE_PROVIDER_SITE_OTHER)
Admission: RE | Admit: 2011-02-07 | Discharge: 2011-02-07 | Disposition: A | Discharge status: Home / Self Care | Payer: BC Managed Care – PPO | Source: Ambulatory Visit | Attending: Family Medicine | Admitting: Family Medicine

## 2011-02-07 ENCOUNTER — Ambulatory Visit (INDEPENDENT_AMBULATORY_CARE_PROVIDER_SITE_OTHER): Payer: BC Managed Care – PPO | Admitting: Family Medicine

## 2011-02-07 ENCOUNTER — Encounter: Payer: Self-pay | Admitting: Family Medicine

## 2011-02-07 VITALS — BP 120/78 | HR 71 | Temp 98.8°F | Ht 65.5 in | Wt 174.1 lb

## 2011-02-07 DIAGNOSIS — M76899 Other specified enthesopathies of unspecified lower limb, excluding foot: Secondary | ICD-10-CM

## 2011-02-07 DIAGNOSIS — IMO0002 Reserved for concepts with insufficient information to code with codable children: Secondary | ICD-10-CM

## 2011-02-07 DIAGNOSIS — M5410 Radiculopathy, site unspecified: Secondary | ICD-10-CM

## 2011-02-07 DIAGNOSIS — M7062 Trochanteric bursitis, left hip: Secondary | ICD-10-CM

## 2011-02-07 MED ORDER — CYCLOBENZAPRINE HCL 10 MG PO TABS: 10.0000 mg | ORAL_TABLET | Freq: Three times a day (TID) | ORAL | Status: AC | PRN

## 2011-02-07 MED ORDER — PREDNISONE 10 MG PO TABS: ORAL_TABLET | ORAL | Status: AC

## 2011-02-07 MED ORDER — HYDROCODONE-ACETAMINOPHEN 5-500 MG PO TABS: 1.0000 | ORAL_TABLET | Freq: Four times a day (QID) | ORAL | Status: AC | PRN

## 2011-02-07 NOTE — Progress Notes (Signed)
Patient Name: Stephanie Kemp Date of Birth: 1945/06/09 Age: 65 y.o. Medical Record Number: [id number] Gender: female  History of Present Illness:  Stephanie Kemp is a 71 y.o. very pleasant female patient who presents with the following:  Was raking some leaves for several leaves -- pain going down her leg and some numbness going down her knee. Went to urgent care and gave some mobic and some pain medications. Yesterday got up and could hardly walk.   Some osteopenia.   No improvement being on meloxicam and tramadol. She still has some significant breakthrough pain. She has excellent range of motion and does yoga and Pilates routinely. She has not had any groin pain. Pain is mostly in the left buttocks region and radiating. She does have some numbness associated with that. No bowel or bladder incontinence. The genital anesthesia.  Past Medical History, Surgical History, Social History, Family History, and Problem List have been reviewed in EHR and updated if relevant.  Review of Systems:  GEN: No fevers, chills. Nontoxic. Primarily MSK c/o today. MSK: Detailed in the HPI GI: tolerating PO intake without difficulty Neuro: detailed above Otherwise the pertinent positives of the ROS are noted above.    Physical Examination: Filed Vitals:   02/07/11 0907  BP: 120/78  Pulse: 71  Temp: 98.8 F (37.1 C)  TempSrc: Oral  Height: 5' 5.5" (1.664 m)  Weight: 174 lb 1.9 oz (78.98 kg)  SpO2: 99%     GEN: Well-developed,well-nourished,in no acute distress; alert,appropriate and cooperative throughout examination HEENT: Normocephalic and atraumatic without obvious abnormalities. Ears, externally no deformities PULM: Breathing comfortably in no respiratory distress EXT: No clubbing, cyanosis, or edema PSYCH: Normally interactive. Cooperative during the interview. Pleasant. Friendly and conversant. Not anxious or depressed appearing. Normal, full affect.  Range of motion at  the waist:  Flexion, extension, lateral bending and rotation: Relatively normal  No echymosis or edema Rises to examination table with mild difficulty Gait: minimally antalgic  Inspection/Deformity: N Paraspinus Tenderness: Minimal around L5-S1  B Ankle Dorsiflexion (L5,4): 5/5 B Great Toe Dorsiflexion (L5,4): 5/5 Heel Walk (L5): WNL Toe Walk (S1): WNL Rise/Squat (L4): WNL, mild pain  SENSORY B Medial Foot (L4):  B Dorsum (L5):  B Lateral (S1): WNL Light Touch: Decreased on the day medial and lateral foot as well as lateral lower extremity to light touch compared to the right side. Pinprick: Decreased on the medial and lateral foot only  REFLEXES Knee (L4): decreased on the L 1+ compared to 2+ on the R Ankle (S1): 2+  B SLR, seated: neg B SLR, supine: neg B FABER: neg B Reverse FABER: neg B Greater Troch: NT B Log Roll: neg B Stork: NT B Sciatic Notch: NT   Assessment and Plan:  1. Radicular low back pain  DG Lumbar Spine Complete, predniSONE (DELTASONE) 10 MG tablet, cyclobenzaprine (FLEXERIL) 10 MG tablet, HYDROcodone-acetaminophen (VICODIN) 5-500 MG per tablet  2. Trochanteric bursitis of left hip  HYDROcodone-acetaminophen (VICODIN) 5-500 MG per tablet    Orders Placed This Encounter  Procedures  . DG Lumbar Spine Complete    Standing Status: Future     Number of Occurrences:      Standing Expiration Date: 04/08/2012    Order Specific Question:  Preferred imaging location?    Answer:  [facility]    Order Specific Question:  Reason for exam:    Answer:  lbp, radiculopathy    Current Outpatient Prescriptions on File Prior to Visit  Medication Sig Dispense Refill  .  levothyroxine (SYNTHROID, LEVOTHROID) 125 MCG tablet Take 125 mcg by mouth daily.          Lumbar radiculopathy, left-sided. Suspect potential lesion around L4-L5. For now we will do long prednisone taper, Vicodin when necessary, Flexeril. With close followup. The patient does have some decreased  sensation, and I think she has some relatively decreased reflexes compared to the other side. We will followup with her closely and obtain back films today. Recheck in one month.

## 2011-02-07 NOTE — Patient Instructions (Signed)
Recheck 1 month with Dr. Patsy Lager

## 2011-02-14 ENCOUNTER — Telehealth: Payer: Self-pay | Admitting: *Deleted

## 2011-02-14 NOTE — Telephone Encounter (Signed)
Difficult to say - 1 week would be an early response. I would finish out her prednisone taper and have her follow-up in the office. I was going to see her in a month, move it up, and I will see her in a week or so.   If she acutely worsens, then I could see her this week, but the majority of people need more time.

## 2011-02-14 NOTE — Telephone Encounter (Signed)
Patient was seen last week and was given 3 prescriptions for her pain and numbness. Patient states that she has not had much improvement since starting the medications. Patient states that she has not been able to work because of the pain. Patient states that she does not know what to expect and wants to know when the pain should ease up. Please advise.  Pharmacy-[facility]

## 2011-02-14 NOTE — Telephone Encounter (Signed)
Patient advised and appt moved to next week

## 2011-02-20 ENCOUNTER — Ambulatory Visit (INDEPENDENT_AMBULATORY_CARE_PROVIDER_SITE_OTHER): Payer: BC Managed Care – PPO | Admitting: Family Medicine

## 2011-02-20 ENCOUNTER — Encounter: Payer: Self-pay | Admitting: Family Medicine

## 2011-02-20 VITALS — BP 120/80 | HR 116 | Temp 98.7°F | Ht 65.5 in | Wt 171.4 lb

## 2011-02-20 DIAGNOSIS — R200 Anesthesia of skin: Secondary | ICD-10-CM

## 2011-02-20 DIAGNOSIS — IMO0002 Reserved for concepts with insufficient information to code with codable children: Secondary | ICD-10-CM

## 2011-02-20 DIAGNOSIS — R209 Unspecified disturbances of skin sensation: Secondary | ICD-10-CM

## 2011-02-20 DIAGNOSIS — M5416 Radiculopathy, lumbar region: Secondary | ICD-10-CM

## 2011-02-20 MED ORDER — HYDROCODONE-ACETAMINOPHEN 5-500 MG PO TABS: 1.0000 | ORAL_TABLET | Freq: Four times a day (QID) | ORAL | Status: AC | PRN

## 2011-02-20 MED ORDER — TRAMADOL HCL 50 MG PO TABS: 50.0000 mg | ORAL_TABLET | Freq: Four times a day (QID) | ORAL | Status: DC | PRN

## 2011-02-20 NOTE — Patient Instructions (Signed)
REFERRAL: GO THE THE FRONT ROOM AT THE ENTRANCE OF OUR CLINIC, NEAR CHECK IN. ASK FOR Stephanie Kemp. SHE WILL HELP YOU SET UP YOUR REFERRAL. DATE: TIME:  

## 2011-02-20 NOTE — Progress Notes (Signed)
Patient Name: Stephanie Kemp Date of Birth: Sep 27, 1945 Age: 65 y.o. Medical Record Number: [id number] Gender: female  History of Present Illness:  Stephanie Kemp is a 37 y.o. very pleasant female patient who presents with the following:  Called last week - not doing well, felt as if worsening - presented with acute back pain and radiculopathy 2 weeks ago with some L LE numbness, now with failure to improve with 2 week prednisone taper, also has been on flexeril, tramadol. No significant back pain history Numbness and weakness - if anything worse now compared to prior evaluation.   Subjective L leg weakness. Fell over the weekend.  Baseline highly active patient. Had to miss a couple of days of work last week.  [facility]  02/07/2011 OV: Was raking some leaves for several leaves -- pain going down her leg and some numbness going down her knee. Went to urgent care and gave some mobic and some pain medications. Yesterday got up and could hardly walk.   Some osteopenia.  No improvement being on meloxicam and tramadol. She still has some significant breakthrough pain. She has excellent range of motion and does yoga and Pilates routinely. She has not had any groin pain. Pain is mostly in the left buttocks region and radiating. She does have some numbness associated with that. No bowel or bladder incontinence. The genital anesthesia.   Past Medical History, Surgical History, Social History, Family History, and Problem List have been reviewed in EHR and updated if relevant.  Review of Systems:  GEN: No fevers, chills. Nontoxic. Primarily MSK c/o today. MSK: Detailed in the HPI GI: tolerating PO intake without difficulty Neuro: detailed above Otherwise the pertinent positives of the ROS are noted above.    Physical Examination: Filed Vitals:   02/20/11 0813  BP: 120/80  Pulse: 116  Temp: 98.7 F (37.1 C)  TempSrc: Oral  Height: 5' 5.5" (1.664 m)  Weight: 171 lb 6.4  oz (77.747 kg)  SpO2: 100%    GEN: Well-developed,well-nourished,in no acute distress; alert,appropriate and cooperative throughout examination HEENT: Normocephalic and atraumatic without obvious abnormalities. Ears, externally no deformities PULM: Breathing comfortably in no respiratory distress EXT: No clubbing, cyanosis, or edema PSYCH: Normally interactive. Cooperative during the interview. Pleasant. Friendly and conversant. Not anxious or depressed appearing. Normal, full affect.  Range of motion at  the waist: Flexion, extension, lateral bending and rotation: pain with extension  No echymosis or edema Rises to examination table with mild difficulty Gait: minimally antalgic  Inspection/Deformity: N Paraspinus Tenderness: mild pain around L5-S1  B Ankle Dorsiflexion (L5,4): 5/5 B Great Toe Dorsiflexion (L5,4): 5/5 Heel Walk (L5): WNL Toe Walk (S1): WNL - painful Rise/Squat (L4): WNL, mild pain  SENSORY B Medial Foot (L4):   B Dorsum (L5):   B Lateral (S1): WNL Light Touch: Decreased on the medial and lateral lower extremity to light touch and pinprick compared to the right side. Pinprick: sensory exam of foot appears improved compared to last exam  REFLEXES Knee (L4): decreased on the L 1+ compared to 2+ on the R Ankle (S1): 2+  B SLR, seated: neg B SLR, supine: neg B FABER: neg B Reverse FABER: neg B Greater Troch: NT B Log Roll: neg B Sciatic Notch: NT  Assessment and Plan:  1. Lumbar radiculopathy  MR Lumbar Spine Wo Contrast  2. Numbness in left leg  MR Lumbar Spine Wo Contrast   L Lumbar radiculopathy with LE numbness and subjective weakness, decreased reflexes at  the knee. Obtain MRI of lumbar spine to eval for potential spinal stenosis, spinal cord edema, disk herniation.  Orders Placed This Encounter  Procedures  . MR Lumbar Spine Wo Contrast    Standing Status: Future     Number of Occurrences:      Standing Expiration Date: 04/21/2012    Order  Specific Question:  Does the patient have a pacemaker, internal devices, implants, aneury    Answer:  No    Order Specific Question:  Preferred imaging location?    Answer:  [facility]    Order Specific Question:  Reason for exam:    Answer:  [facility], lumbar spine MRI - worsening back pain, numbness left lower leg, decreased DTR on the L compared to R    Medications Discontinued During This Encounter  Medication Reason  . traMADol (ULTRAM) 50 MG tablet Reorder

## 2011-02-23 ENCOUNTER — Ambulatory Visit: Payer: Self-pay | Admitting: Family Medicine

## 2011-02-24 ENCOUNTER — Telehealth: Payer: Self-pay | Admitting: Family Medicine

## 2011-02-24 NOTE — Telephone Encounter (Signed)
noted 

## 2011-02-24 NOTE — Progress Notes (Signed)
Addended by: Kerby Nora E on: 02/24/2011 12:49 PM   Modules accepted: Orders

## 2011-02-24 NOTE — Telephone Encounter (Signed)
[facility] called to ask you to change the mri order to a with and without contrast because the Radiologist saw something and wanted to use contrast. Dr B cancelled your order and put a new order in. I faxed the order to [facility] to [phone].

## 2011-02-27 ENCOUNTER — Encounter: Payer: Self-pay | Admitting: Family Medicine

## 2011-02-27 ENCOUNTER — Telehealth: Payer: Self-pay | Admitting: Internal Medicine

## 2011-02-27 DIAGNOSIS — M5410 Radiculopathy, site unspecified: Secondary | ICD-10-CM

## 2011-02-27 DIAGNOSIS — R200 Anesthesia of skin: Secondary | ICD-10-CM

## 2011-02-27 NOTE — Telephone Encounter (Signed)
Patient had a MRI done last Thursday and was inquiring about the results.  Please advise.

## 2011-02-27 NOTE — Telephone Encounter (Signed)
Ordered by Dr. Salena Saner.. Scanned into computer.. Recommend awaiting his return tommorow for recommendations.

## 2011-02-28 ENCOUNTER — Telehealth: Payer: Self-pay | Admitting: *Deleted

## 2011-02-28 NOTE — Telephone Encounter (Signed)
Pt needs a letter sent to her employer stating that she is under your care. She states she is missing a lot of work due to back pain. Employer fax is [phone]

## 2011-02-28 NOTE — Telephone Encounter (Signed)
Informed patient that Dr. Patsy Lager will review it today as his return and someone will give her a call.

## 2011-02-28 NOTE — Telephone Encounter (Signed)
Patient okay with seeing  Dr. In G[place] Otto and is also asking for letter for supervisor b/c she has had to miss work for appts

## 2011-02-28 NOTE — Telephone Encounter (Signed)
Call  MRI does not look terrible. 1 small disk bulge. This does not look like a surgical problem.  Rather than seeing a spine surgeon, I would suggest seeing Physical Medicine / Interventional Spine MD. I know all of them in [facility] -- at [facility] there will likely be a longer wait time to be seen, but I can find out who she should see if she has a strong preference to go there.

## 2011-03-02 ENCOUNTER — Encounter: Payer: Self-pay | Admitting: Family Medicine

## 2011-03-02 NOTE — Telephone Encounter (Signed)
Letter mailed to patient job

## 2011-03-02 NOTE — Telephone Encounter (Signed)
Heather, I just did this work note. Can you stamp my signature and FAX to her office. (the fax # is in another phone note from a few days ago)  Thanks!

## 2011-03-08 ENCOUNTER — Ambulatory Visit: Payer: BC Managed Care – PPO | Admitting: Family Medicine

## 2011-05-29 ENCOUNTER — Other Ambulatory Visit: Payer: Self-pay | Admitting: Family Medicine

## 2011-07-11 ENCOUNTER — Other Ambulatory Visit: Payer: BC Managed Care – PPO

## 2011-07-14 ENCOUNTER — Encounter: Payer: BC Managed Care – PPO | Admitting: Family Medicine

## 2011-07-19 LAB — HM MAMMOGRAPHY: HM Mammogram: NORMAL

## 2011-07-20 ENCOUNTER — Telehealth: Payer: Self-pay | Admitting: Family Medicine

## 2011-07-20 DIAGNOSIS — E039 Hypothyroidism, unspecified: Secondary | ICD-10-CM

## 2011-07-20 DIAGNOSIS — E7800 Pure hypercholesterolemia, unspecified: Secondary | ICD-10-CM

## 2011-07-20 DIAGNOSIS — M899 Disorder of bone, unspecified: Secondary | ICD-10-CM

## 2011-07-20 NOTE — Telephone Encounter (Signed)
Message copied by Excell Seltzer on Thu Jul 20, 2011 10:13 PM ------      Message from: Alvina Chou      Created: Thu Jul 20, 2011  9:23 AM       Patient is scheduled for CPX labs, please order future labs, Thanks , Camelia Eng

## 2011-07-24 ENCOUNTER — Other Ambulatory Visit (INDEPENDENT_AMBULATORY_CARE_PROVIDER_SITE_OTHER): Payer: BC Managed Care – PPO

## 2011-07-24 DIAGNOSIS — E039 Hypothyroidism, unspecified: Secondary | ICD-10-CM

## 2011-07-24 DIAGNOSIS — E7800 Pure hypercholesterolemia, unspecified: Secondary | ICD-10-CM

## 2011-07-24 DIAGNOSIS — M899 Disorder of bone, unspecified: Secondary | ICD-10-CM

## 2011-07-24 LAB — COMPREHENSIVE METABOLIC PANEL
ALT: 15 U/L (ref 0–35)
AST: 21 U/L (ref 0–37)
Albumin: 4 g/dL (ref 3.5–5.2)
Alkaline Phosphatase: 42 U/L (ref 39–117)
BUN: 19 mg/dL (ref 6–23)
CO2: 25 mEq/L (ref 19–32)
Calcium: 8.6 mg/dL (ref 8.4–10.5)
Chloride: 106 mEq/L (ref 96–112)
Creatinine, Ser: 0.8 mg/dL (ref 0.4–1.2)
GFR: 75.6 mL/min (ref >60.00)
Glucose, Bld: 89 mg/dL (ref 70–99)
Potassium: 3.7 mEq/L (ref 3.5–5.1)
Sodium: 141 mEq/L (ref 135–145)
Total Bilirubin: 0.7 mg/dL (ref 0.3–1.2)
Total Protein: 7 g/dL (ref 6.0–8.3)

## 2011-07-24 LAB — LDL CHOLESTEROL, DIRECT: Direct LDL: 160.2 mg/dL

## 2011-07-24 LAB — LIPID PANEL
Cholesterol: 243 mg/dL — ABNORMAL HIGH (ref 0–200)
HDL: 79.7 mg/dL (ref >39.00)
Total CHOL/HDL Ratio: 3
Triglycerides: 82 mg/dL (ref 0.0–149.0)
VLDL: 16.4 mg/dL (ref 0.0–40.0)

## 2011-07-24 LAB — TSH: TSH: 1.62 u[IU]/mL (ref 0.35–5.50)

## 2011-07-25 ENCOUNTER — Other Ambulatory Visit: Payer: BC Managed Care – PPO

## 2011-07-25 LAB — VITAMIN D 25 HYDROXY (VIT D DEFICIENCY, FRACTURES): Vit D, 25-Hydroxy: 39 ng/mL (ref 30–89)

## 2011-07-26 ENCOUNTER — Telehealth: Payer: Self-pay | Admitting: Family Medicine

## 2011-07-26 NOTE — Telephone Encounter (Signed)
Patient requesting lab results be faxed to [phone] or call at [phone] with results.  Thanks

## 2011-07-26 NOTE — Telephone Encounter (Signed)
Patient advised via message no urgent lab results and she will discuss them on Friday

## 2011-07-28 ENCOUNTER — Ambulatory Visit (INDEPENDENT_AMBULATORY_CARE_PROVIDER_SITE_OTHER): Payer: BC Managed Care – PPO | Admitting: Family Medicine

## 2011-07-28 ENCOUNTER — Other Ambulatory Visit (HOSPITAL_COMMUNITY)
Admission: RE | Admit: 2011-07-28 | Discharge: 2011-07-28 | Disposition: A | Discharge status: Home / Self Care | Payer: BC Managed Care – PPO | Source: Ambulatory Visit | Attending: Family Medicine | Admitting: Family Medicine

## 2011-07-28 ENCOUNTER — Encounter: Payer: Self-pay | Admitting: Family Medicine

## 2011-07-28 VITALS — BP 118/80 | HR 78 | Temp 98.7°F | Ht 64.75 in | Wt 174.0 lb

## 2011-07-28 DIAGNOSIS — M899 Disorder of bone, unspecified: Secondary | ICD-10-CM

## 2011-07-28 DIAGNOSIS — E039 Hypothyroidism, unspecified: Secondary | ICD-10-CM

## 2011-07-28 DIAGNOSIS — Z0000 Encounter for general adult medical examination without abnormal findings: Secondary | ICD-10-CM

## 2011-07-28 DIAGNOSIS — E7800 Pure hypercholesterolemia, unspecified: Secondary | ICD-10-CM

## 2011-07-28 DIAGNOSIS — Z01419 Encounter for gynecological examination (general) (routine) without abnormal findings: Secondary | ICD-10-CM

## 2011-07-28 DIAGNOSIS — Z1211 Encounter for screening for malignant neoplasm of colon: Secondary | ICD-10-CM

## 2011-07-28 DIAGNOSIS — M949 Disorder of cartilage, unspecified: Secondary | ICD-10-CM

## 2011-07-28 DIAGNOSIS — Z1159 Encounter for screening for other viral diseases: Secondary | ICD-10-CM | POA: Insufficient documentation

## 2011-07-28 NOTE — Patient Instructions (Addendum)
Work on increasing exercise as tolerated. Healthy diet, weight loss. Red yeast rice 2400 mg divided daily. Review cholesterol info packet. Complete stool cards for colon cancer screening. Please schedule mammogram.  Return in 3 months to recheck cholesterol, fasting.

## 2011-07-28 NOTE — Progress Notes (Signed)
Subjective:    Patient ID: Stephanie Kemp, female    DOB: 06/10/1945, 66 y.o.   MRN: [id number]  HPI The patient is here for annual wellness exam and preventative care.    Hypothyroid,  Lab Results  Component Value Date   TSH 1.62 07/24/2011   Elevated Cholesterol: Inadequate control.. Goal <130. Using medications without problems:Not on Diet compliance:Good Exercise: Has not been exercisng lately due to back, some pilates. Other complaints:  Lumbar radiculopathy, gradually improving. Radiculopathy improved but some numbness remaining. Minimal pain, no weakness, Balance off some what. Not interested in referral  At this point. MRI reviewed.     Review of Systems  Constitutional: Negative for fever, fatigue and unexpected weight change.  HENT: Negative for ear pain, congestion, sore throat, sneezing, trouble swallowing and sinus pressure.   Eyes: Negative for pain and itching.  Respiratory: Negative for cough, shortness of breath and wheezing.   Cardiovascular: Negative for chest pain, palpitations and leg swelling.  Gastrointestinal: Negative for nausea, abdominal pain, diarrhea, constipation and blood in stool.  Genitourinary: Negative for dysuria, hematuria, vaginal discharge, difficulty urinating and menstrual problem.  Musculoskeletal: Positive for back pain.  Skin: Negative for rash.  Neurological: Negative for syncope, weakness, light-headedness, numbness and headaches.  Psychiatric/Behavioral: Negative for confusion and dysphoric mood. The patient is not nervous/anxious.        Objective:   Physical Exam  Constitutional: Vital signs are normal. She appears well-developed and well-nourished. She is cooperative.  Non-toxic appearance. She does not appear ill. No distress.  HENT:  Head: Normocephalic.  Right Ear: Hearing, tympanic membrane, external ear and ear canal normal.  Left Ear: Hearing, tympanic membrane, external ear and ear canal normal.  Nose: Nose normal.   Eyes: Conjunctivae, EOM and lids are normal. Pupils are equal, round, and reactive to light. No foreign bodies found.  Neck: Trachea normal and normal range of motion. Neck supple. Carotid bruit is not present. No mass and no thyromegaly present.  Cardiovascular: Normal rate, regular rhythm, S1 normal, S2 normal, normal heart sounds and intact distal pulses.  Exam reveals no gallop.   No murmur heard. Pulmonary/Chest: Effort normal and breath sounds normal. No respiratory distress. She has no wheezes. She has no rhonchi. She has no rales.  Abdominal: Soft. Normal appearance and bowel sounds are normal. She exhibits no distension, no fluid wave, no abdominal bruit and no mass. There is no hepatosplenomegaly. There is no tenderness. There is no rebound, no guarding and no CVA tenderness. No hernia.  Genitourinary: Vagina normal and uterus normal. No breast swelling, tenderness, discharge or bleeding. Pelvic exam was performed with patient prone. There is no rash, tenderness or lesion on the right labia. There is no rash, tenderness or lesion on the left labia. Uterus is not enlarged and not tender. Cervix exhibits no motion tenderness, no discharge and no friability. Right adnexum displays no mass, no tenderness and no fullness. Left adnexum displays no mass, no tenderness and no fullness.  Lymphadenopathy:    She has no cervical adenopathy.    She has no axillary adenopathy.  Neurological: She is alert. She has normal strength. No cranial nerve deficit or sensory deficit.  Skin: Skin is warm, dry and intact. No rash noted.  Psychiatric: Her speech is normal and behavior is normal. Judgment normal. Her mood appears not anxious. Cognition and memory are normal. She does not exhibit a depressed mood.          Assessment & Plan:  The  patient's preventative maintenance and recommended screening tests for an annual wellness exam were reviewed in full today. Brought up to date unless services  declined.  Counselled on the importance of diet, exercise, and its role in overall health and mortality. The patient's FH and SH was reviewed, including their home life, tobacco status, and drug and alcohol status.   Vaccines:  Up to date TD, Discussed shingles vaccine... May get a work Colon: Stool card Mammo: nml 2012 PAP/DVE:last pap 2011 nml, if normal today then space out to every 3 years. DVE: vit D nml,  nml 2012

## 2011-08-02 NOTE — Assessment & Plan Note (Addendum)
Inadequate control.. Work on increasing exercise as tolerated. Healthy diet, weight loss. Red yeast rice 2400 mg divided daily. Review cholesterol info packet. Return in 3 months to recheck cholesterol, fasting

## 2011-08-02 NOTE — Assessment & Plan Note (Signed)
Well controlled at last check. 

## 2011-08-03 NOTE — Progress Notes (Signed)
Addended by: Alvina Chou on: 08/03/2011 03:37 PM   Modules accepted: Orders

## 2011-08-04 ENCOUNTER — Encounter: Payer: Self-pay | Admitting: *Deleted

## 2011-08-04 ENCOUNTER — Other Ambulatory Visit: Payer: BC Managed Care – PPO

## 2011-08-04 DIAGNOSIS — Z1211 Encounter for screening for malignant neoplasm of colon: Secondary | ICD-10-CM

## 2011-08-07 ENCOUNTER — Encounter: Payer: Self-pay | Admitting: *Deleted

## 2011-08-10 ENCOUNTER — Ambulatory Visit: Payer: Self-pay | Admitting: Family Medicine

## 2011-08-10 ENCOUNTER — Encounter: Payer: Self-pay | Admitting: Family Medicine

## 2011-08-15 ENCOUNTER — Encounter: Payer: Self-pay | Admitting: *Deleted

## 2012-01-04 ENCOUNTER — Other Ambulatory Visit: Payer: Self-pay | Admitting: Family Medicine

## 2012-07-31 ENCOUNTER — Other Ambulatory Visit: Payer: Self-pay | Admitting: *Deleted

## 2012-07-31 MED ORDER — LEVOTHYROXINE SODIUM 125 MCG PO TABS: ORAL_TABLET | ORAL | Status: DC

## 2012-09-12 ENCOUNTER — Other Ambulatory Visit: Payer: Self-pay | Admitting: *Deleted

## 2012-09-12 NOTE — Telephone Encounter (Signed)
Received fax refill request, it has been over a year since pt has been seen and pt doesn't have any future appt scheduled. The last refill we did we advised pt that f/u appt is needed for future refills. Left voicemail requesting pt to call office, so we can schedule a f/u appt before we refill med

## 2012-09-12 NOTE — Telephone Encounter (Signed)
Left voicemail requesting pt to call office 

## 2012-09-12 NOTE — Telephone Encounter (Signed)
Pt request cb at [phone].

## 2012-09-13 MED ORDER — LEVOTHYROXINE SODIUM 125 MCG PO TABS: ORAL_TABLET | ORAL | Status: DC

## 2012-09-13 NOTE — Telephone Encounter (Signed)
appt made for august and medication sent to pharmacy

## 2012-10-10 ENCOUNTER — Telehealth: Payer: Self-pay | Admitting: Family Medicine

## 2012-10-10 DIAGNOSIS — E039 Hypothyroidism, unspecified: Secondary | ICD-10-CM

## 2012-10-10 DIAGNOSIS — M899 Disorder of bone, unspecified: Secondary | ICD-10-CM

## 2012-10-10 DIAGNOSIS — E7800 Pure hypercholesterolemia, unspecified: Secondary | ICD-10-CM

## 2012-10-10 NOTE — Telephone Encounter (Signed)
Message copied by Excell Seltzer on Thu Oct 10, 2012  2:28 PM ------      Message from: Alvina Chou      Created: Tue Oct 01, 2012 11:37 AM      Regarding: Lab orders for Friday, 7.25.14       Patient is scheduled for CPX labs, please order future labs, Thanks , Terri       ------

## 2012-10-11 ENCOUNTER — Ambulatory Visit: Payer: Self-pay | Admitting: Family Medicine

## 2012-10-11 ENCOUNTER — Encounter: Payer: Self-pay | Admitting: Family Medicine

## 2012-10-11 ENCOUNTER — Other Ambulatory Visit (INDEPENDENT_AMBULATORY_CARE_PROVIDER_SITE_OTHER): Payer: BC Managed Care – PPO

## 2012-10-11 DIAGNOSIS — R5383 Other fatigue: Secondary | ICD-10-CM

## 2012-10-11 DIAGNOSIS — M949 Disorder of cartilage, unspecified: Secondary | ICD-10-CM

## 2012-10-11 DIAGNOSIS — E7800 Pure hypercholesterolemia, unspecified: Secondary | ICD-10-CM

## 2012-10-11 DIAGNOSIS — R5381 Other malaise: Secondary | ICD-10-CM

## 2012-10-11 DIAGNOSIS — E039 Hypothyroidism, unspecified: Secondary | ICD-10-CM

## 2012-10-11 LAB — COMPREHENSIVE METABOLIC PANEL
Alkaline Phosphatase: 49 U/L (ref 39–117)
BUN: 16 mg/dL (ref 6–23)
GFR: 61.17 mL/min (ref >60.00)
Glucose, Bld: 83 mg/dL (ref 70–99)
Total Bilirubin: 0.7 mg/dL (ref 0.3–1.2)

## 2012-10-11 LAB — LDL CHOLESTEROL, DIRECT: Direct LDL: 145.3 mg/dL

## 2012-10-11 LAB — LIPID PANEL
Cholesterol: 221 mg/dL — ABNORMAL HIGH (ref 0–200)
Total CHOL/HDL Ratio: 4
Triglycerides: 87 mg/dL (ref 0.0–149.0)
VLDL: 17.4 mg/dL (ref 0.0–40.0)

## 2012-10-14 ENCOUNTER — Encounter: Payer: Self-pay | Admitting: *Deleted

## 2012-10-18 ENCOUNTER — Ambulatory Visit (INDEPENDENT_AMBULATORY_CARE_PROVIDER_SITE_OTHER): Payer: BC Managed Care – PPO | Admitting: Family Medicine

## 2012-10-18 ENCOUNTER — Encounter: Payer: Self-pay | Admitting: Family Medicine

## 2012-10-18 VITALS — BP 112/68 | HR 79 | Temp 98.2°F | Wt 176.0 lb

## 2012-10-18 DIAGNOSIS — Z0000 Encounter for general adult medical examination without abnormal findings: Secondary | ICD-10-CM

## 2012-10-18 DIAGNOSIS — E039 Hypothyroidism, unspecified: Secondary | ICD-10-CM

## 2012-10-18 DIAGNOSIS — E7800 Pure hypercholesterolemia, unspecified: Secondary | ICD-10-CM

## 2012-10-18 DIAGNOSIS — Z23 Encounter for immunization: Secondary | ICD-10-CM

## 2012-10-18 DIAGNOSIS — Z1212 Encounter for screening for malignant neoplasm of rectum: Secondary | ICD-10-CM

## 2012-10-18 NOTE — Progress Notes (Signed)
HPI  The patient is here for annual wellness exam and preventative care.   Hypothyroid, well controlled. Lab Results  Component Value Date   TSH 0.56 10/11/2012   Elevated Cholesterol: Inadequate control.. Goal <130.  Lab Results  Component Value Date   CHOL 221* 10/11/2012   HDL 56.80 10/11/2012   LDLCALC 110* 05/28/2009   LDLDIRECT 145.3 10/11/2012   TRIG 87.0 10/11/2012   CHOLHDL 4 10/11/2012  Using medications without problems:Not on  Diet compliance:Good, weight wtcher  Exercise: Pilates, treadmill, walking several times a week. Other complaints:   Lumbar radiculopathy, gradually improving. She has been doing pilates.  Review of Systems  Constitutional: Negative for fever, fatigue and unexpected weight change.  HENT: Negative for ear pain, congestion, sore throat, sneezing, trouble swallowing and sinus pressure.  Eyes: Negative for pain and itching.  Respiratory: Negative for cough, shortness of breath and wheezing.  Cardiovascular: Negative for chest pain, palpitations and leg swelling.  Gastrointestinal: Negative for nausea, abdominal pain, diarrhea, constipation and blood in stool.  Genitourinary: Negative for dysuria, hematuria, vaginal discharge, difficulty urinating and menstrual problem.  Musculoskeletal: Positive for back pain.  Skin: Negative for rash.  Neurological: Negative for syncope, weakness, light-headedness, numbness and headaches.  Psychiatric/Behavioral: Negative for confusion and dysphoric mood. The patient is not nervous/anxious.  Objective:   Physical Exam  Constitutional: Vital signs are normal. She appears well-developed and well-nourished. She is cooperative. Non-toxic appearance. She does not appear ill. No distress.  HENT:  Head: Normocephalic.  Right Ear: Hearing, tympanic membrane, external ear and ear canal normal.  Left Ear: Hearing, tympanic membrane, external ear and ear canal normal.  Nose: Nose normal.  Eyes: Conjunctivae, EOM and lids are  normal. Pupils are equal, round, and reactive to light. No foreign bodies found.  Neck: Trachea normal and normal range of motion. Neck supple. Carotid bruit is not present. No mass and no thyromegaly present.  Cardiovascular: Normal rate, regular rhythm, S1 normal, S2 normal, normal heart sounds and intact distal pulses. Exam reveals no gallop.  No murmur heard.  Pulmonary/Chest: Effort normal and breath sounds normal. No respiratory distress. She has no wheezes. She has no rhonchi. She has no rales.  Abdominal: Soft. Normal appearance and bowel sounds are normal. She exhibits no distension, no fluid wave, no abdominal bruit and no mass. There is no hepatosplenomegaly. There is no tenderness. There is no rebound, no guarding and no CVA tenderness. No hernia.  Genitourinary: Vagina normal and uterus normal. No breast swelling, tenderness, discharge or bleeding. Pelvic exam was performed with patient prone. There is no rash, tenderness or lesion on the right labia. There is no rash, tenderness or lesion on the left labia. Uterus is not enlarged and not tender. Cervix exhibits no motion tenderness, no discharge and no friability. Right adnexum displays no mass, no tenderness and no fullness. Left adnexum displays no mass, no tenderness and no fullness.  Lymphadenopathy:  She has no cervical adenopathy.  She has no axillary adenopathy.  Neurological: She is alert. She has normal strength. No cranial nerve deficit or sensory deficit.  Skin: Skin is warm, dry and intact. No rash noted.  Psychiatric: Her speech is normal and behavior is normal. Judgment normal. Her mood appears not anxious. Cognition and memory are normal. She does not exhibit a depressed mood.  Assessment & Plan:   The patient's preventative maintenance and recommended screening tests for an annual wellness exam were reviewed in full today.  Brought up to date unless services  declined.  Counselled on the importance of diet, exercise, and  its role in overall health and mortality.  The patient's FH and SH was reviewed, including their home life, tobacco status, and drug and alcohol status.   Vaccines: Up to date TD, will get shingles  and pneumovax vaccine today. Colon: Stool card  Mammo: nml 2014 PAP/DVE:last pap 2013 nml, on every 3 year schedule. DVE: yearly DEXA: vit D nml, nml 2012.Marland Kitchen Plan repeating 2017.

## 2012-10-18 NOTE — Addendum Note (Signed)
Addended by: Sueanne Margarita on: 10/18/2012 03:49 PM   Modules accepted: Orders

## 2012-10-18 NOTE — Assessment & Plan Note (Signed)
Restart red yeast rice Encouraged exercise, weight loss, healthy eating habits.  

## 2012-10-18 NOTE — Patient Instructions (Addendum)
Keep working on regular exercise, weight loss and healthy eating.  Start back on red yeast rice 1200 mg twice daily. Follow up for for high cholesterol and weight with with fasting labs prior. Stop at lab on your way out tho pick up stool cards.

## 2012-10-31 ENCOUNTER — Other Ambulatory Visit (INDEPENDENT_AMBULATORY_CARE_PROVIDER_SITE_OTHER): Payer: BC Managed Care – PPO

## 2012-10-31 DIAGNOSIS — Z1212 Encounter for screening for malignant neoplasm of rectum: Secondary | ICD-10-CM

## 2012-10-31 LAB — FECAL OCCULT BLOOD, IMMUNOCHEMICAL: Fecal Occult Bld: NEGATIVE

## 2012-11-20 ENCOUNTER — Other Ambulatory Visit: Payer: Self-pay

## 2012-11-20 MED ORDER — LEVOTHYROXINE SODIUM 125 MCG PO TABS: ORAL_TABLET | ORAL | Status: DC

## 2012-11-20 NOTE — Telephone Encounter (Signed)
Pt left v/m requesting 90 day levothyroxine 125 mcg to [facility] [address]. Refill done and pt advised.

## 2012-12-23 ENCOUNTER — Telehealth: Payer: Self-pay

## 2012-12-23 NOTE — Telephone Encounter (Signed)
Pt will come by after 12/24/12 and pick up at front desk.

## 2012-12-23 NOTE — Telephone Encounter (Signed)
Pt left v/m requesting latest lab results to be faxed to [phone]. Left v/m for pt that labs can be mailed to pts home address or pt can pick up copy at front desk at our office.

## 2012-12-24 NOTE — Telephone Encounter (Signed)
Pt request to pick up copy of 10/11/12 lab results at front desk.  Release ID # [id number]. Pt notified ready for pick up at front desk.

## 2013-07-01 ENCOUNTER — Telehealth: Payer: Self-pay | Admitting: *Deleted

## 2013-07-01 NOTE — Telephone Encounter (Signed)
Patient called and requested a printout of the immunizations that she has received especially the shingles vaccine. A copy printed out and left at the front for patient to pick up. Patient aware.

## 2013-11-25 ENCOUNTER — Other Ambulatory Visit: Payer: Self-pay | Admitting: Family Medicine

## 2013-11-27 ENCOUNTER — Other Ambulatory Visit: Payer: Self-pay | Admitting: Family Medicine

## 2014-01-28 ENCOUNTER — Telehealth: Payer: Self-pay | Admitting: Family Medicine

## 2014-01-28 DIAGNOSIS — E038 Other specified hypothyroidism: Secondary | ICD-10-CM

## 2014-01-28 DIAGNOSIS — E7800 Pure hypercholesterolemia, unspecified: Secondary | ICD-10-CM

## 2014-01-28 DIAGNOSIS — M8580 Other specified disorders of bone density and structure, unspecified site: Secondary | ICD-10-CM

## 2014-01-28 NOTE — Telephone Encounter (Signed)
-----   Message from Alvina Chouerri J Walsh sent at 01/23/2014  3:27 PM EST ----- Regarding: Lab orders for Thursday, 11.12.15 Patient is scheduled for CPX labs, please order future labs, Thanks , Camelia Engerri

## 2014-01-29 ENCOUNTER — Other Ambulatory Visit (INDEPENDENT_AMBULATORY_CARE_PROVIDER_SITE_OTHER): Payer: BC Managed Care – PPO

## 2014-01-29 DIAGNOSIS — E038 Other specified hypothyroidism: Secondary | ICD-10-CM

## 2014-01-29 DIAGNOSIS — M8580 Other specified disorders of bone density and structure, unspecified site: Secondary | ICD-10-CM

## 2014-01-29 DIAGNOSIS — E7800 Pure hypercholesterolemia, unspecified: Secondary | ICD-10-CM

## 2014-01-29 LAB — COMPREHENSIVE METABOLIC PANEL
ALK PHOS: 42 U/L (ref 39–117)
ALT: 21 U/L (ref 0–35)
AST: 23 U/L (ref 0–37)
Albumin: 3.6 g/dL (ref 3.5–5.2)
BILIRUBIN TOTAL: 0.4 mg/dL (ref 0.2–1.2)
BUN: 24 mg/dL — ABNORMAL HIGH (ref 6–23)
CO2: 21 meq/L (ref 19–32)
Calcium: 9.3 mg/dL (ref 8.4–10.5)
Chloride: 108 mEq/L (ref 96–112)
Creatinine, Ser: 0.9 mg/dL (ref 0.4–1.2)
GFR: 63.17 mL/min (ref >60.00)
Glucose, Bld: 91 mg/dL (ref 70–99)
Potassium: 4.4 mEq/L (ref 3.5–5.1)
SODIUM: 142 meq/L (ref 135–145)
Total Protein: 7 g/dL (ref 6.0–8.3)

## 2014-01-29 LAB — LIPID PANEL
CHOL/HDL RATIO: 4
Cholesterol: 222 mg/dL — ABNORMAL HIGH (ref 0–200)
HDL: 53.4 mg/dL (ref >39.00)
LDL Cholesterol: 148 mg/dL — ABNORMAL HIGH (ref 0–99)
NONHDL: 168.6
TRIGLYCERIDES: 104 mg/dL (ref 0.0–149.0)
VLDL: 20.8 mg/dL (ref 0.0–40.0)

## 2014-01-29 LAB — VITAMIN D 25 HYDROXY (VIT D DEFICIENCY, FRACTURES): VITD: 28.06 ng/mL — ABNORMAL LOW (ref 30.00–100.00)

## 2014-01-29 LAB — TSH: TSH: 0.74 u[IU]/mL (ref 0.35–4.50)

## 2014-01-30 ENCOUNTER — Telehealth: Payer: Self-pay | Admitting: *Deleted

## 2014-01-30 NOTE — Telephone Encounter (Signed)
Labs faxed as requested to [phone].

## 2014-01-30 NOTE — Telephone Encounter (Signed)
-----   Message from Cyd SilenceNatasha Chisholm Chavers sent at 01/30/2014 11:44 AM EST ----- Regarding: Pt request labs faxed to her  Pt requested labs be faxed to her job at [phone] attn: Laurene FootmanLinda Bonifield. She needs to submit results to ins company.   Thanks Rodney Boozeasha

## 2014-02-05 ENCOUNTER — Encounter: Payer: BC Managed Care – PPO | Admitting: Family Medicine

## 2014-02-18 ENCOUNTER — Ambulatory Visit (INDEPENDENT_AMBULATORY_CARE_PROVIDER_SITE_OTHER): Payer: BC Managed Care – PPO | Admitting: Family Medicine

## 2014-02-18 ENCOUNTER — Encounter: Payer: Self-pay | Admitting: Family Medicine

## 2014-02-18 VITALS — BP 136/92 | HR 80 | Temp 99.1°F | Ht 64.5 in | Wt 173.0 lb

## 2014-02-18 DIAGNOSIS — Z1211 Encounter for screening for malignant neoplasm of colon: Secondary | ICD-10-CM

## 2014-02-18 DIAGNOSIS — E559 Vitamin D deficiency, unspecified: Secondary | ICD-10-CM

## 2014-02-18 DIAGNOSIS — Z0000 Encounter for general adult medical examination without abnormal findings: Secondary | ICD-10-CM

## 2014-02-18 MED ORDER — VITAMIN D (ERGOCALCIFEROL) 1.25 MG (50000 UNIT) PO CAPS: 50000.0000 [IU] | ORAL_CAPSULE | ORAL | Status: DC

## 2014-02-18 NOTE — Progress Notes (Signed)
Pre visit review using our clinic review tool, if applicable. No additional management support is needed unless otherwise documented below in the visit note. 

## 2014-02-18 NOTE — Patient Instructions (Addendum)
Restart red yeast rice at 1200 mg twice daily.  Replete vit D weekly x 12 weeks then start OTC vit D3 400 IU 1-2 times a day. Call if you are interested in prevnar vaccine. Stop at lab on way out for ifob.

## 2014-02-18 NOTE — Assessment & Plan Note (Signed)
Replete

## 2014-02-18 NOTE — Progress Notes (Signed)
The patient is here for annual wellness exam and preventative care.    Lumbar radiculopathy MRI several years ago:  She has some issues with back pain that is worse with raking leaves.  Heat and aleve helps. Plans to do pilates.  Hypothyroid, well controlled. Lab Results  Component Value Date   TSH 0.74 01/29/2014     Elevated Cholesterol: Inadequate control.. Goal <130. Red yeast rice: she forgets to take it. Lab Results  Component Value Date   CHOL 222* 01/29/2014   HDL 53.40 01/29/2014   LDLCALC 148* 01/29/2014   LDLDIRECT 145.3 10/11/2012   TRIG 104.0 01/29/2014   CHOLHDL 4 01/29/2014  Using medications without problems:Not on  Diet compliance:Good, on metafast diet/ meal replacement Exercise: Non current exercise. Other complaints:    Vit D low:  No supplement regular.    Wt Readings from Last 3 Encounters:  02/18/14 173 lb (78.472 kg)  10/18/12 176 lb (79.833 kg)  07/28/11 174 lb (78.926 kg)     Review of Systems  Constitutional: Negative for fever, fatigue and unexpected weight change.  HENT: Negative for ear pain, congestion, sore throat, sneezing, trouble swallowing and sinus pressure.  Eyes: Negative for pain and itching.  Respiratory: Negative for cough, shortness of breath and wheezing.  Cardiovascular: Negative for chest pain, palpitations and leg swelling.  Gastrointestinal: Negative for nausea, abdominal pain, diarrhea, constipation and blood in stool.  Genitourinary: Negative for dysuria, hematuria, vaginal discharge, difficulty urinating and menstrual problem.  Musculoskeletal: Positive for back pain.  Skin: Negative for rash.  Neurological: Negative for syncope, weakness, light-headedness, numbness and headaches.  Psychiatric/Behavioral: Negative for confusion and dysphoric mood. The patient is not nervous/anxious.  Objective:   Physical Exam  Constitutional: Vital signs are normal. She appears well-developed and well-nourished. She  is cooperative. Non-toxic appearance. She does not appear ill. No distress.  HENT:  Head: Normocephalic.  Right Ear: Hearing, tympanic membrane, external ear and ear canal normal.  Left Ear: Hearing, tympanic membrane, external ear and ear canal normal.  Nose: Nose normal.  Eyes: Conjunctivae, EOM and lids are normal. Pupils are equal, round, and reactive to light. No foreign bodies found.  Neck: Trachea normal and normal range of motion. Neck supple. Carotid bruit is not present. No mass and no thyromegaly present.  Cardiovascular: Normal rate, regular rhythm, S1 normal, S2 normal, normal heart sounds and intact distal pulses. Exam reveals no gallop.  No murmur heard.  Pulmonary/Chest: Effort normal and breath sounds normal. No respiratory distress. She has no wheezes. She has no rhonchi. She has no rales.  Abdominal: Soft. Normal appearance and bowel sounds are normal. She exhibits no distension, no fluid wave, no abdominal bruit and no mass. There is no hepatosplenomegaly. There is no tenderness. There is no rebound, no guarding and no CVA tenderness. No hernia.  Genitourinary: NO DVE or PAP, Breast exam normal, no masses, nontender.  Lymphadenopathy:  She has no cervical adenopathy.  She has no axillary adenopathy.  Neurological: She is alert. She has normal strength. No cranial nerve deficit or sensory deficit.  Skin: Skin is warm, dry and intact. No rash noted.  Psychiatric: Her speech is normal and behavior is normal. Judgment normal. Her mood appears not anxious. Cognition and memory are normal. She does not exhibit a depressed mood.  Assessment & Plan:   The patient's preventative maintenance and recommended screening tests for an annual wellness exam were reviewed in full today.  Brought up to date unless services declined.  Counselled  on the importance of diet, exercise, and its role in overall health and mortality.  The patient's FH and SH was reviewed, including  their home life, tobacco status, and drug and alcohol status.   Vaccines: Up to date TD,  shingles and pneumovax. [facility]. May also have had prevnar, will check. Colon: ifob Mammo: nml 2014, plan check every other year. PAP/DVE:last pap 2013 nml, no longer indicated. DVE: every other year, low risk, no symptoms and no family history of  Uterine and ovarian. DEXA: vit D low, nml 2012.Marland Kitchen. Plan repeating 2017.

## 2014-02-26 ENCOUNTER — Other Ambulatory Visit (INDEPENDENT_AMBULATORY_CARE_PROVIDER_SITE_OTHER): Payer: BC Managed Care – PPO

## 2014-02-26 DIAGNOSIS — Z1211 Encounter for screening for malignant neoplasm of colon: Secondary | ICD-10-CM

## 2014-02-26 LAB — FECAL OCCULT BLOOD, IMMUNOCHEMICAL: Fecal Occult Bld: NEGATIVE

## 2014-02-27 ENCOUNTER — Encounter: Payer: Self-pay | Admitting: *Deleted

## 2014-03-02 ENCOUNTER — Other Ambulatory Visit: Payer: Self-pay | Admitting: Family Medicine

## 2014-03-04 NOTE — Telephone Encounter (Signed)
Pt called for status of levothyroxine refill; advised sent to [facility] [address]. Pt will ck with pharmacy.

## 2015-01-15 ENCOUNTER — Telehealth: Payer: Self-pay | Admitting: Family Medicine

## 2015-01-15 ENCOUNTER — Ambulatory Visit (INDEPENDENT_AMBULATORY_CARE_PROVIDER_SITE_OTHER): Payer: Medicare Other | Admitting: *Deleted

## 2015-01-15 DIAGNOSIS — Z23 Encounter for immunization: Secondary | ICD-10-CM

## 2015-01-15 NOTE — Telephone Encounter (Signed)
Needs Prevnar 13

## 2015-01-15 NOTE — Telephone Encounter (Signed)
Vaccination completed with flu

## 2015-01-15 NOTE — Telephone Encounter (Signed)
Pt called to make flu shot appointment today and wanted to know if she could get her pneumonia shot also Please advise Pt has appointment @ 12:45

## 2015-03-25 ENCOUNTER — Telehealth: Payer: Self-pay | Admitting: Family Medicine

## 2015-03-25 ENCOUNTER — Other Ambulatory Visit: Payer: Self-pay | Admitting: Family Medicine

## 2015-03-25 ENCOUNTER — Other Ambulatory Visit (INDEPENDENT_AMBULATORY_CARE_PROVIDER_SITE_OTHER): Payer: Medicare Other

## 2015-03-25 DIAGNOSIS — E7800 Pure hypercholesterolemia, unspecified: Secondary | ICD-10-CM | POA: Diagnosis not present

## 2015-03-25 DIAGNOSIS — Z1159 Encounter for screening for other viral diseases: Secondary | ICD-10-CM | POA: Diagnosis not present

## 2015-03-25 DIAGNOSIS — E038 Other specified hypothyroidism: Secondary | ICD-10-CM | POA: Diagnosis not present

## 2015-03-25 DIAGNOSIS — E559 Vitamin D deficiency, unspecified: Secondary | ICD-10-CM | POA: Diagnosis not present

## 2015-03-25 LAB — COMPREHENSIVE METABOLIC PANEL
ALBUMIN: 4.1 g/dL (ref 3.5–5.2)
ALK PHOS: 51 U/L (ref 39–117)
ALT: 17 U/L (ref 0–35)
AST: 19 U/L (ref 0–37)
BILIRUBIN TOTAL: 0.5 mg/dL (ref 0.2–1.2)
BUN: 23 mg/dL (ref 6–23)
CO2: 28 mEq/L (ref 19–32)
Calcium: 9.1 mg/dL (ref 8.4–10.5)
Chloride: 105 mEq/L (ref 96–112)
Creatinine, Ser: 0.95 mg/dL (ref 0.40–1.20)
GFR: 62.19 mL/min (ref >60.00)
Glucose, Bld: 99 mg/dL (ref 70–99)
POTASSIUM: 3.6 meq/L (ref 3.5–5.1)
SODIUM: 141 meq/L (ref 135–145)
TOTAL PROTEIN: 6.8 g/dL (ref 6.0–8.3)

## 2015-03-25 LAB — T3, FREE: T3, Free: 3.1 pg/mL (ref 2.3–4.2)

## 2015-03-25 LAB — LIPID PANEL
CHOLESTEROL: 236 mg/dL — AB (ref 0–200)
HDL: 62 mg/dL (ref >39.00)
LDL Cholesterol: 148 mg/dL — ABNORMAL HIGH (ref 0–99)
NONHDL: 173.84
Total CHOL/HDL Ratio: 4
Triglycerides: 127 mg/dL (ref 0.0–149.0)
VLDL: 25.4 mg/dL (ref 0.0–40.0)

## 2015-03-25 LAB — TSH: TSH: 1.06 u[IU]/mL (ref 0.35–4.50)

## 2015-03-25 LAB — T4, FREE: FREE T4: 0.9 ng/dL (ref 0.60–1.60)

## 2015-03-25 LAB — VITAMIN D 25 HYDROXY (VIT D DEFICIENCY, FRACTURES): VITD: 26.55 ng/mL — AB (ref 30.00–100.00)

## 2015-03-25 NOTE — Telephone Encounter (Signed)
-----   Message from Baldomero LamyNatasha C Chavers sent at 03/17/2015  2:12 PM EST ----- Regarding: cpx labs 1/5, need orders. Thanks :-) Please order  future cpx labs for pt's upcoming lab appt. Thanks Rodney Boozeasha

## 2015-03-26 LAB — HEPATITIS C ANTIBODY: HCV Ab: NEGATIVE

## 2015-03-30 ENCOUNTER — Ambulatory Visit (INDEPENDENT_AMBULATORY_CARE_PROVIDER_SITE_OTHER): Payer: Medicare Other | Admitting: Family Medicine

## 2015-03-30 ENCOUNTER — Encounter: Payer: Self-pay | Admitting: Family Medicine

## 2015-03-30 VITALS — BP 112/60 | HR 75 | Temp 98.4°F | Ht 64.0 in | Wt 172.0 lb

## 2015-03-30 DIAGNOSIS — E559 Vitamin D deficiency, unspecified: Secondary | ICD-10-CM

## 2015-03-30 DIAGNOSIS — G8929 Other chronic pain: Secondary | ICD-10-CM

## 2015-03-30 DIAGNOSIS — Z1211 Encounter for screening for malignant neoplasm of colon: Secondary | ICD-10-CM | POA: Diagnosis not present

## 2015-03-30 DIAGNOSIS — E348 Other specified endocrine disorders: Secondary | ICD-10-CM

## 2015-03-30 DIAGNOSIS — Z7189 Other specified counseling: Secondary | ICD-10-CM

## 2015-03-30 DIAGNOSIS — M545 Low back pain, unspecified: Secondary | ICD-10-CM

## 2015-03-30 DIAGNOSIS — E7800 Pure hypercholesterolemia, unspecified: Secondary | ICD-10-CM

## 2015-03-30 DIAGNOSIS — Z0000 Encounter for general adult medical examination without abnormal findings: Secondary | ICD-10-CM

## 2015-03-30 DIAGNOSIS — E038 Other specified hypothyroidism: Secondary | ICD-10-CM

## 2015-03-30 DIAGNOSIS — Z1231 Encounter for screening mammogram for malignant neoplasm of breast: Secondary | ICD-10-CM

## 2015-03-30 MED ORDER — DICLOFENAC SODIUM 75 MG PO TBEC: 75.0000 mg | DELAYED_RELEASE_TABLET | Freq: Two times a day (BID) | ORAL | Status: DC

## 2015-03-30 MED ORDER — ATORVASTATIN CALCIUM 20 MG PO TABS: 20.0000 mg | ORAL_TABLET | Freq: Every day | ORAL | Status: DC

## 2015-03-30 MED ORDER — LEVOTHYROXINE SODIUM 125 MCG PO TABS: 125.0000 ug | ORAL_TABLET | Freq: Every day | ORAL | Status: DC

## 2015-03-30 MED ORDER — VITAMIN D (ERGOCALCIFEROL) 1.25 MG (50000 UNIT) PO CAPS: 50000.0000 [IU] | ORAL_CAPSULE | ORAL | Status: DC

## 2015-03-30 NOTE — Assessment & Plan Note (Signed)
Replete

## 2015-03-30 NOTE — Assessment & Plan Note (Signed)
Trial of diclofenac.  If not imrpoving consider reimaging given may be spinal stenosis.

## 2015-03-30 NOTE — Progress Notes (Signed)
The patient is here for annual wellness exam and preventative care.  I have personally reviewed the Medicare Annual Wellness questionnaire and have noted 1. The patient's medical and social history 2. Their use of alcohol, tobacco or illicit drugs 3. Their current medications and supplements 4. The patient's functional ability including ADL's, fall risks, home safety risks and hearing or visual             impairment. 5. Diet and physical activities 6. Evidence for depression or mood disorders 7.         Updated provider list Cognitive evaluation was performed and recorded on pt medicare questionnaire form. The patients weight, height, BMI and visual acuity have been recorded in the chart  I have made referrals, counseling and provided education to the patient based review of the above and I have provided the pt with a written personalized care plan for preventive services.   She is doing well overall.  She does have low back pain across both sides. Some pain in left buttock. No radiation of pain. No numbness, some bilateral mild weakness. Bilateral legs tired and heavy. Pain is gradually worsening in last year. Lumbar radiculopathy MRI 2012: large paracentral disc protrusion L2 L3. She did not move forward for referral at that time.   She is interested in further treatment at this time.   Hypothyroid, well controlled. Lab Results  Component Value Date   TSH 1.06 03/25/2015   Elevated Cholesterol: Inadequate control.. Goal <130. Red yeast rice 1200 mg BID: she taking it regularly. Lab Results  Component Value Date   CHOL 236* 03/25/2015   HDL 62.00 03/25/2015   LDLCALC 148* 03/25/2015   LDLDIRECT 145.3 10/11/2012   TRIG 127.0 03/25/2015   CHOLHDL 4 03/25/2015  Using medications without problems:None Diet compliance: Good, water, low chol. Exercise:  Walking 3 miles 3-4 times a week. Other complaints:   Vit D low: On ca and vit D,regular.  Wt Readings from Last 3  Encounters:  03/30/15 172 lb (78.019 kg)  02/18/14 173 lb (78.472 kg)  10/18/12 176 lb (79.833 kg)   Body mass index is 29.51 kg/(m^2).   BP Readings from Last 3 Encounters:  03/30/15 112/60  02/18/14 136/92  10/18/12 112/68     Review of Systems  Constitutional: Negative for fever, fatigue and unexpected weight change.  HENT: Negative for ear pain, congestion, sore throat, sneezing, trouble swallowing and sinus pressure.  Eyes: Negative for pain and itching.  Respiratory: Negative for cough, shortness of breath and wheezing.  Cardiovascular: Negative for chest pain, palpitations and leg swelling.  Gastrointestinal: Negative for nausea, abdominal pain, diarrhea, constipation and blood in stool.  Genitourinary: Negative for dysuria, hematuria, vaginal discharge, difficulty urinating and menstrual problem.  Musculoskeletal: Positive for back pain.  Skin: Negative for rash.  Neurological: Negative for syncope, weakness, light-headedness, numbness and headaches.  Psychiatric/Behavioral: Negative for confusion and dysphoric mood. The patient is not nervous/anxious.  Objective:   Physical Exam  Constitutional: Vital signs are normal. She appears well-developed and well-nourished. She is cooperative. Non-toxic appearance. She does not appear ill. No distress.  HENT:  Head: Normocephalic.  Right Ear: Hearing, tympanic membrane, external ear and ear canal normal.  Left Ear: Hearing, tympanic membrane, external ear and ear canal normal.  Nose: Nose normal.  Eyes: Conjunctivae, EOM and lids are normal. Pupils are equal, round, and reactive to light. No foreign bodies found.  Neck: Trachea normal and normal range of motion. Neck supple. Carotid bruit is not  present. No mass and no thyromegaly present.  Cardiovascular: Normal rate, regular rhythm, S1 normal, S2 normal, normal heart sounds and intact distal pulses. Exam reveals no gallop.  No murmur heard.   Pulmonary/Chest: Effort normal and breath sounds normal. No respiratory distress. She has no wheezes. She has no rhonchi. She has no rales.  Abdominal: Soft. Normal appearance and bowel sounds are normal. She exhibits no distension, no fluid wave, no abdominal bruit and no mass. There is no hepatosplenomegaly. There is no tenderness. There is no rebound, no guarding and no CVA tenderness. No hernia.  Genitourinary: Normal introitus for age, no external lesions, no vaginal discharge, mucosa pink and moist, no vaginal , no vaginal atrophy, no friaility or hemorrhage, normal uterus size and position, no adnexal masses or tenderness.  Chaperoned exam. , Breast exam normal, no masses, nontender.  Lymphadenopathy:  She has no cervical adenopathy.  She has no axillary adenopathy.  Neurological: She is alert. She has normal strength. No cranial nerve deficit or sensory deficit.  Skin: Skin is warm, dry and intact. No rash noted.  Psychiatric: Her speech is normal and behavior is normal. Judgment normal. Her mood appears not anxious. Cognition and memory are normal. She does not exhibit a depressed mood.  Assessment & Plan:   The patient's preventative maintenance and recommended screening tests for an annual wellness exam were reviewed in full today.  Brought up to date unless services declined.  Counselled on the importance of diet, exercise, and its role in overall health and mortality.  The patient's FH and SH was reviewed, including their home life, tobacco status, and drug and alcohol status.   Vaccines: Up to date. . Colon: ifob neg 02/2014, repeat Mammo: nml 2014, plan check every other year, due now. PAP/DVE:last pap 2013 nml, no longer indicated. DVE: every other year, low risk, no symptoms and no family history of Uterine and ovarian. DEXA: vit D low, nml 2012.Marland Kitchen. Plan repeating 2017. Now.      Hep C: done

## 2015-03-30 NOTE — Addendum Note (Signed)
Addended by: Kerby NoraBEDSOLE, Inara Dike E on: 03/30/2015 03:36 PM   Modules accepted: Orders, SmartSet

## 2015-03-30 NOTE — Assessment & Plan Note (Signed)
Stable control. Refilled

## 2015-03-30 NOTE — Patient Instructions (Addendum)
Stop red yeast rice. Start atorvastatin 20 mg daily. Low cholesterol diet. Keep up with exercise and weight loss. Supplement weekly x 12 vit d then afterward.. Vit d3 capsule 400 IU twice daily. Pick up stool test in lab on way out. Stop at front desk to set up mammogram and DEXA.

## 2015-03-30 NOTE — Assessment & Plan Note (Signed)
Encouraged exercise, weight loss, healthy eating habits. Stop red yeast rice and start atorvastatin 20 mg daily.  Repeat lab eval in 3 months.

## 2015-03-30 NOTE — Progress Notes (Signed)
Pre visit review using our clinic review tool, if applicable. No additional management support is needed unless otherwise documented below in the visit note. 

## 2015-04-02 NOTE — Addendum Note (Signed)
Addended by: Kerby NoraBEDSOLE, Tulio Facundo E on: 04/02/2015 02:05 PM   Modules accepted: Level of Service, SmartSet

## 2015-04-02 NOTE — Progress Notes (Signed)
Changes made and code changed.

## 2015-04-02 NOTE — Assessment & Plan Note (Signed)
HCPOA:  Onalee HuaDavid and Theresia BoughSylvia womble She is a Art gallery managerJehovah Witness. Reviewed 2017

## 2015-04-07 ENCOUNTER — Ambulatory Visit
Admission: RE | Admit: 2015-04-07 | Discharge: 2015-04-07 | Disposition: A | Discharge status: Home / Self Care | Payer: Medicare Other | Source: Ambulatory Visit | Attending: Family Medicine | Admitting: Family Medicine

## 2015-04-07 ENCOUNTER — Other Ambulatory Visit: Payer: Self-pay | Admitting: Family Medicine

## 2015-04-07 DIAGNOSIS — Z13820 Encounter for screening for osteoporosis: Secondary | ICD-10-CM | POA: Diagnosis not present

## 2015-04-07 DIAGNOSIS — E348 Other specified endocrine disorders: Secondary | ICD-10-CM

## 2015-04-07 DIAGNOSIS — M8580 Other specified disorders of bone density and structure, unspecified site: Secondary | ICD-10-CM | POA: Insufficient documentation

## 2015-04-07 DIAGNOSIS — Z1231 Encounter for screening mammogram for malignant neoplasm of breast: Secondary | ICD-10-CM | POA: Insufficient documentation

## 2015-04-07 DIAGNOSIS — M8588 Other specified disorders of bone density and structure, other site: Secondary | ICD-10-CM | POA: Diagnosis not present

## 2015-04-09 ENCOUNTER — Telehealth: Payer: Self-pay | Admitting: Family Medicine

## 2015-04-09 DIAGNOSIS — M8580 Other specified disorders of bone density and structure, unspecified site: Secondary | ICD-10-CM

## 2015-04-09 NOTE — Telephone Encounter (Signed)
Ms. Aveni notified as instructed by telephone.

## 2015-04-09 NOTE — Telephone Encounter (Signed)
Notify pt that DEXa shows  osteopenia. Rec ca, vit D and weight bearing exercise. Repeat in 2-5 years.

## 2015-04-30 ENCOUNTER — Other Ambulatory Visit (INDEPENDENT_AMBULATORY_CARE_PROVIDER_SITE_OTHER): Payer: Medicare Other

## 2015-04-30 ENCOUNTER — Encounter: Payer: Self-pay | Admitting: *Deleted

## 2015-04-30 DIAGNOSIS — Z1211 Encounter for screening for malignant neoplasm of colon: Secondary | ICD-10-CM

## 2015-04-30 LAB — FECAL OCCULT BLOOD, IMMUNOCHEMICAL: Fecal Occult Bld: NEGATIVE

## 2015-06-28 ENCOUNTER — Telehealth: Payer: Self-pay | Admitting: Family Medicine

## 2015-06-28 ENCOUNTER — Other Ambulatory Visit (INDEPENDENT_AMBULATORY_CARE_PROVIDER_SITE_OTHER): Payer: Medicare Other

## 2015-06-28 DIAGNOSIS — E559 Vitamin D deficiency, unspecified: Secondary | ICD-10-CM

## 2015-06-28 DIAGNOSIS — E7800 Pure hypercholesterolemia, unspecified: Secondary | ICD-10-CM

## 2015-06-28 DIAGNOSIS — E038 Other specified hypothyroidism: Secondary | ICD-10-CM

## 2015-06-28 LAB — LIPID PANEL
CHOL/HDL RATIO: 3
Cholesterol: 175 mg/dL (ref 0–200)
HDL: 62.5 mg/dL (ref >39.00)
LDL CALC: 97 mg/dL (ref 0–99)
NonHDL: 112.94
TRIGLYCERIDES: 82 mg/dL (ref 0.0–149.0)
VLDL: 16.4 mg/dL (ref 0.0–40.0)

## 2015-06-28 LAB — VITAMIN D 25 HYDROXY (VIT D DEFICIENCY, FRACTURES): VITD: 40.35 ng/mL (ref 30.00–100.00)

## 2015-06-28 LAB — COMPREHENSIVE METABOLIC PANEL
ALT: 16 U/L (ref 0–35)
AST: 16 U/L (ref 0–37)
Albumin: 4.2 g/dL (ref 3.5–5.2)
Alkaline Phosphatase: 46 U/L (ref 39–117)
BUN: 16 mg/dL (ref 6–23)
CALCIUM: 9.3 mg/dL (ref 8.4–10.5)
CHLORIDE: 104 meq/L (ref 96–112)
CO2: 29 meq/L (ref 19–32)
Creatinine, Ser: 0.91 mg/dL (ref 0.40–1.20)
GFR: 65.3 mL/min (ref >60.00)
GLUCOSE: 97 mg/dL (ref 70–99)
Potassium: 4.1 mEq/L (ref 3.5–5.1)
Sodium: 139 mEq/L (ref 135–145)
Total Bilirubin: 0.6 mg/dL (ref 0.2–1.2)
Total Protein: 7 g/dL (ref 6.0–8.3)

## 2015-06-28 NOTE — Telephone Encounter (Signed)
-----   Message from Alvina Chouerri J Walsh sent at 06/18/2015 11:15 AM EDT ----- Regarding: lab orders for Monday, April 10,17 Lab orders for a 3 month follow up appt.

## 2015-06-30 ENCOUNTER — Telehealth: Payer: Self-pay

## 2015-06-30 NOTE — Telephone Encounter (Signed)
Patient returned call. Both AWV and OV30 rescheduled.

## 2015-06-30 NOTE — Telephone Encounter (Signed)
Attempted to reach patient to reschedule appointment.

## 2015-07-09 ENCOUNTER — Ambulatory Visit: Payer: Medicare Other | Admitting: Family Medicine

## 2015-07-09 ENCOUNTER — Ambulatory Visit: Payer: Medicare Other

## 2015-07-16 ENCOUNTER — Ambulatory Visit (INDEPENDENT_AMBULATORY_CARE_PROVIDER_SITE_OTHER)
Admission: RE | Admit: 2015-07-16 | Discharge: 2015-07-16 | Disposition: A | Discharge status: Home / Self Care | Payer: Medicare Other | Source: Ambulatory Visit | Attending: Family Medicine | Admitting: Family Medicine

## 2015-07-16 ENCOUNTER — Encounter: Payer: Self-pay | Admitting: Family Medicine

## 2015-07-16 ENCOUNTER — Ambulatory Visit (INDEPENDENT_AMBULATORY_CARE_PROVIDER_SITE_OTHER): Payer: Medicare Other | Admitting: Family Medicine

## 2015-07-16 ENCOUNTER — Ambulatory Visit: Payer: Medicare Other

## 2015-07-16 VITALS — BP 126/84 | HR 74 | Temp 98.7°F | Ht 64.0 in | Wt 173.5 lb

## 2015-07-16 DIAGNOSIS — M545 Low back pain, unspecified: Secondary | ICD-10-CM

## 2015-07-16 DIAGNOSIS — E559 Vitamin D deficiency, unspecified: Secondary | ICD-10-CM | POA: Diagnosis not present

## 2015-07-16 DIAGNOSIS — G8929 Other chronic pain: Secondary | ICD-10-CM

## 2015-07-16 DIAGNOSIS — E7800 Pure hypercholesterolemia, unspecified: Secondary | ICD-10-CM | POA: Diagnosis not present

## 2015-07-16 DIAGNOSIS — Z0000 Encounter for general adult medical examination without abnormal findings: Secondary | ICD-10-CM

## 2015-07-16 DIAGNOSIS — E038 Other specified hypothyroidism: Secondary | ICD-10-CM | POA: Diagnosis not present

## 2015-07-16 DIAGNOSIS — M47816 Spondylosis without myelopathy or radiculopathy, lumbar region: Secondary | ICD-10-CM | POA: Diagnosis not present

## 2015-07-16 MED ORDER — MELOXICAM 15 MG PO TABS: 15.0000 mg | ORAL_TABLET | Freq: Every day | ORAL | Status: DC

## 2015-07-16 NOTE — Progress Notes (Addendum)
70 year old female presents for PART 2 of medicare wellness.  Note from Novella Rob was reviewed in detail.  Chronic back pain, poor control:  Daily pain, no new injury.Bilateral low back, no radiation to legs. No weakness, no numbness. Bilateral legs tired and heavy. Feels better leaning forward. Increases in pain with standing and walking. Diclofenac makes her very sleepy. She wishes to try a different medication. Some stress incontinence, no stool incontinence. No numbness in groin. No fever.  MRI 2012: At L2-L3 there is a mild broad-based disc bulge with a superimposed  left paracentral large disc protrusion with inferior migration of disc material  and versus posterior disc, measuring approximately 10 mm, and has mass  effect upon the left intrathecal nerve roots in particular the L3 nerve  root. There is mild bilateral facet arthropathy.    Hypothyroid, well controlled. Lab Results  Component Value Date   TSH 1.06 03/25/2015   Elevated Cholesterol: great control.. Goal <130. On atorvastatin 20 mg daily. Lab Results  Component Value Date   CHOL 175 06/28/2015   HDL 62.50 06/28/2015   LDLCALC 97 06/28/2015   LDLDIRECT 145.3 10/11/2012   TRIG 82.0 06/28/2015   CHOLHDL 3 06/28/2015  Using medications without problems:None Diet compliance: Good, water, low chol. Exercise: Walking 3 miles 3-4 times a week. Other complaints:   Vit D low:no resolved. On ca and vit D, regular.  Wt Readings from Last 3 Encounters:  07/16/15 173 lb 8 oz (78.699 kg)  07/16/15 173 lb 8 oz (78.699 kg)  03/30/15 172 lb (78.019 kg)    BP Readings from Last 3 Encounters:  07/16/15 126/84  07/16/15 126/84  03/30/15 112/60    PAP/DVE:last pap 2013 nml, no longer indicated. DVE: every other year, low risk, no symptoms and no family history of Uterine and ovarian.              Social History /Family History/Past Medical History reviewed and updated if  needed.   Review of Systems  Constitutional: Negative for fever, fatigue and unexpected weight change.  HENT: Negative for ear pain, congestion, sore throat, sneezing, trouble swallowing and sinus pressure.  Eyes: Negative for pain and itching.  Respiratory: Negative for cough, shortness of breath and wheezing.  Cardiovascular: Negative for chest pain, palpitations and leg swelling.  Gastrointestinal: Negative for nausea, abdominal pain, diarrhea, constipation and blood in stool.  Genitourinary: Negative for dysuria, hematuria, vaginal discharge, difficulty urinating and menstrual problem.  Musculoskeletal: Positive for back pain.  Skin: Negative for rash.  Neurological: Negative for syncope, weakness, light-headedness, numbness and headaches.  Psychiatric/Behavioral: Negative for confusion and dysphoric mood. The patient is not nervous/anxious.  Objective:   Physical Exam  Constitutional: Vital signs are normal. She appears well-developed and well-nourished. She is cooperative. Non-toxic appearance. She does not appear ill. No distress.  HENT:  Head: Normocephalic.  Right Ear: Hearing, tympanic membrane, external ear and ear canal normal.  Left Ear: Hearing, tympanic membrane, external ear and ear canal normal.  Nose: Nose normal.  Eyes: Conjunctivae, EOM and lids are normal. Pupils are equal, round, and reactive to light. No foreign bodies found.  Neck: Trachea normal and normal range of motion. Neck supple. Carotid bruit is not present. No mass and no thyromegaly present.  Cardiovascular: Normal rate, regular rhythm, S1 normal, S2 normal, normal heart sounds and intact distal pulses. Exam reveals no gallop.  No murmur heard.  Pulmonary/Chest: Effort normal and breath sounds normal. No respiratory distress. She has no wheezes.  She has no rhonchi. She has no rales.  Abdominal: Soft. Normal appearance and bowel sounds are normal. She exhibits no distension, no  fluid wave, no abdominal bruit and no mass. There is no hepatosplenomegaly. There is no tenderness. There is no rebound, no guarding and no  MSK: ttp in bilateral paraspinous muscles, neg SLR, neg faber's Lymphadenopathy:  She has no cervical adenopathy.  She has no axillary adenopathy.  Neurological: She is alert. She has normal strength. No cranial nerve deficit or sensory deficit.  Skin: Skin is warm, dry and intact. No rash noted.  Psychiatric: Her speech is normal and behavior is normal. Judgment normal. Her mood appears not anxious. Cognition and memory are normal. She does not exhibit a depressed mood

## 2015-07-16 NOTE — Assessment & Plan Note (Signed)
Eval with X-ray. Concern for spinal stenosis, but no red flags for emergent surgery needed.  Change to meloxicam  Given SE to diclofenac. Refer to PT.

## 2015-07-16 NOTE — Assessment & Plan Note (Signed)
Resolved

## 2015-07-16 NOTE — Patient Instructions (Addendum)
Preventive Care for Adults  A healthy lifestyle and preventive care can promote health and wellness. Preventive health guidelines for adults include the following key practices.  . A routine yearly physical is a good way to check with your health care provider about your health and preventive screening. It is a chance to share any concerns and updates on your health and to receive a thorough exam.  . Visit your dentist for a routine exam and preventive care every 6 months. Brush your teeth twice a day and floss once a day. Good oral hygiene prevents tooth decay and gum disease.  . The frequency of eye exams is based on your age, health, family medical history, use  of contact lenses, and other factors. Follow your health care provider's ecommendations for frequency of eye exams.  . Eat a healthy diet. Foods like vegetables, fruits, whole grains, low-fat dairy products, and lean protein foods contain the nutrients you need without too many calories. Decrease your intake of foods high in solid fats, added sugars, and salt. Eat the right amount of calories for you. Get information about a proper diet from your health care provider, if necessary.  . Regular physical exercise is one of the most important things you can do for your health. Most adults should get at least 150 minutes of moderate-intensity exercise (any activity that increases your heart rate and causes you to sweat) each week. In addition, most adults need muscle-strengthening exercises on 2 or more days a week.  Silver Sneakers may be a benefit available to you. To determine eligibility, you may visit the website: www.silversneakers.com or contact program at 1-866-584-7389 Mon-Fri between 8AM-8PM.   . Maintain a healthy weight. The body mass index (BMI) is a screening tool to identify possible weight problems. It provides an estimate of body fat based on height and weight. Your health care provider can find your BMI and can help you  achieve or maintain a healthy weight.   For adults 20 years and older: ? A BMI below 18.5 is considered underweight. ? A BMI of 18.5 to 24.9 is normal. ? A BMI of 25 to 29.9 is considered overweight. ? A BMI of 30 and above is considered obese.   . Maintain normal blood lipids and cholesterol levels by exercising and minimizing your intake of saturated fat. Eat a balanced diet with plenty of fruit and vegetables. Blood tests for lipids and cholesterol should begin at age 20 and be repeated every 5 years. If your lipid or cholesterol levels are high, you are over 50, or you are at high risk for heart disease, you may need your cholesterol levels checked more frequently. Ongoing high lipid and cholesterol levels should be treated with medicines if diet and exercise are not working.  . If you smoke, find out from your health care provider how to quit. If you do not use tobacco, please do not start.  . If you choose to drink alcohol, please do not consume more than 2 drinks per day. One drink is considered to be 12 ounces (355 mL) of beer, 5 ounces (148 mL) of wine, or 1.5 ounces (44 mL) of liquor.  . If you are 55-79 years old, ask your health care provider if you should take aspirin to prevent strokes.  . Use sunscreen. Apply sunscreen liberally and repeatedly throughout the day. You should seek shade when your shadow is shorter than you. Protect yourself by wearing long sleeves, pants, a wide-brimmed hat, and sunglasses year   round, whenever you are outdoors.  . Once a month, do a whole body skin exam, using a mirror to look at the skin on your back. Tell your health care provider of new moles, moles that have irregular borders, moles that are larger than a pencil eraser, or moles that have changed in shape or color.  Preventive Care for Adults  A healthy lifestyle and preventive care can promote health and wellness. Preventive health guidelines for adults include the following key  practices.  . A routine yearly physical is a good way to check with your health care provider about your health and preventive screening. It is a chance to share any concerns and updates on your health and to receive a thorough exam.  . Visit your dentist for a routine exam and preventive care every 6 months. Brush your teeth twice a day and floss once a day. Good oral hygiene prevents tooth decay and gum disease.  . The frequency of eye exams is based on your age, health, family medical history, use  of contact lenses, and other factors. Follow your health care provider's ecommendations for frequency of eye exams.  . Eat a healthy diet. Foods like vegetables, fruits, whole grains, low-fat dairy products, and lean protein foods contain the nutrients you need without too many calories. Decrease your intake of foods high in solid fats, added sugars, and salt. Eat the right amount of calories for you. Get information about a proper diet from your health care provider, if necessary.  . Regular physical exercise is one of the most important things you can do for your health. Most adults should get at least 150 minutes of moderate-intensity exercise (any activity that increases your heart rate and causes you to sweat) each week. In addition, most adults need muscle-strengthening exercises on 2 or more days a week.  Silver Sneakers may be a benefit available to you. To determine eligibility, you may visit the website: www.silversneakers.com or contact program at [phone] Mon-Fri between 8AM-8PM.   . Maintain a healthy weight. The body mass index (BMI) is a screening tool to identify possible weight problems. It provides an estimate of body fat based on height and weight. Your health care provider can find your BMI and can help you achieve or maintain a healthy weight.   For adults 20 years and older: ? A BMI below 18.5 is considered underweight. ? A BMI of 18.5 to 24.9 is normal. ? A BMI of 25 to  29.9 is considered overweight. ? A BMI of 30 and above is considered obese.   . Maintain normal blood lipids and cholesterol levels by exercising and minimizing your intake of saturated fat. Eat a balanced diet with plenty of fruit and vegetables. Blood tests for lipids and cholesterol should begin at age 68 and be repeated every 5 years. If your lipid or cholesterol levels are high, you are over 50, or you are at high risk for heart disease, you may need your cholesterol levels checked more frequently. Ongoing high lipid and cholesterol levels should be treated with medicines if diet and exercise are not working.  . If you smoke, find out from your health care provider how to quit. If you do not use tobacco, please do not start.  . If you choose to drink alcohol, please do not consume more than 2 drinks per day. One drink is considered to be 12 ounces (355 mL) of beer, 5 ounces (148 mL) of wine, or 1.5 ounces (44 mL)  of liquor.  . If you are 59-27 years old, ask your health care provider if you should take aspirin to prevent strokes.  . Use sunscreen. Apply sunscreen liberally and repeatedly throughout the day. You should seek shade when your shadow is shorter than you. Protect yourself by wearing long sleeves, pants, a wide-brimmed hat, and sunglasses year round, whenever you are outdoors.  . Once a month, do a whole body skin exam, using a mirror to look at the skin on your back. Tell your health care provider of new moles, moles that have irregular borders, moles that are larger than a pencil eraser, or moles that have changed in shape or color.     Marland Kitchen

## 2015-07-16 NOTE — Assessment & Plan Note (Signed)
Excellent control on atorvastatin and low chol diet Encouraged exercise, weight loss, healthy eating habits.

## 2015-07-16 NOTE — Progress Notes (Signed)
Pre visit review using our clinic review tool, if applicable. No additional management support is needed unless otherwise documented below in the visit note. 

## 2015-07-16 NOTE — Patient Instructions (Addendum)
Stop diclofenac. Trial of meloxicam. Stop at front desk on way out for referral to PT.  We will call with X-ray results.  Start daily baby aspirin.

## 2015-07-16 NOTE — Progress Notes (Signed)
Subjective:   Stephanie Kemp is a 70 y.o. female who presents for Medicare Annual (Subsequent) preventive examination.  Review of Systems: N/A Cardiac Risk Factors include: advanced age (>5255men, 64>65 women);dyslipidemia     Objective:     Vitals: BP 126/84 mmHg  Pulse 74  Temp(Src) 98.7 F (37.1 C) (Oral)  Ht 5\' 4"  (1.626 m)  Wt 173 lb 8 oz (78.699 kg)  BMI 29.77 kg/m2  SpO2 96%  Body mass index is 29.77 kg/(m^2).   Tobacco History  Smoking status  . Former Smoker  Smokeless tobacco  . Never Used    Comment: Quit over 17 years ago.A 6 pack per year history     Counseling given: No   Past Medical History  Diagnosis Date  . S/P dilatation and curettage    Past Surgical History  Procedure Laterality Date  . 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 Heart disease Mother     MI age 68's  . Parkinsonism Mother     ?  Marland Kitchen. Cancer Maternal Aunt     breast  . Breast cancer Maternal Aunt   . Cancer Maternal Grandmother     breast cancer  . Breast cancer Maternal Grandmother   . Cancer Brother     small cell lung cancer  . Mental illness Other     Bipolar   History  Sexual Activity  . Sexual Activity: No    Outpatient Encounter Prescriptions as of 07/16/2015  Medication Sig  . Calcium Citrate-Vitamin D (CALCIUM + D PO) Take 1 capsule by mouth 2 (two) times daily.  . diclofenac (VOLTAREN) 75 MG EC tablet Take 1 tablet (75 mg total) by mouth 2 (two) times daily.  Marland Kitchen. levothyroxine (SYNTHROID, LEVOTHROID) 125 MCG tablet Take 1 tablet (125 mcg total) by mouth daily.  . [DISCONTINUED] Vitamin D, Ergocalciferol, (DRISDOL) 50000 units CAPS capsule Take 1 capsule (50,000 Units total) by mouth every 7 (seven) days.  Marland Kitchen. atorvastatin (LIPITOR) 20 MG tablet Take 1 tablet (20 mg total) by mouth daily.   No facility-administered encounter medications on file as of 07/16/2015.    Activities of Daily  Living In your present state of health, do you have any difficulty performing the following activities: 07/16/2015  Hearing? N  Vision? N  Difficulty concentrating or making decisions? N  Walking or climbing stairs? N  Dressing or bathing? N  Doing errands, shopping? N  Preparing Food and eating ? N  Using the Toilet? N  In the past six months, have you accidently leaked urine? Y  Do you have problems with loss of bowel control? N  Managing your Medications? N  Managing your Finances? N  Housekeeping or managing your Housekeeping? N    Patient Care Team: Excell SeltzerAmy E Bedsole, MD as PCP - General    Assessment:     Hearing Screening   125Hz  250Hz  500Hz  1000Hz  2000Hz  4000Hz  8000Hz   Right ear:   40 40 40 40   Left ear:   0 40 40 40   Vision Screening Comments: Last eye exam in Fall 2016    Exercise Activities and Dietary recommendations Current Exercise Habits: Home exercise routine, Type of exercise: walking, Time (Minutes): 60, Frequency (Times/Week): 6, Weekly Exercise (Minutes/Week): 360, Intensity: Mild, Exercise limited by: None identified  Goals    . Increase physical activity     Starting 07/16/2015, I  will continue walking at least 6000-7000 steps per day.       Fall Risk Fall Risk  07/16/2015 07/16/2015 02/18/2014  Falls in the past year? No No No   Depression Screen PHQ 2/9 Scores 07/16/2015 07/16/2015 02/18/2014  PHQ - 2 Score 0 0 0     Cognitive Testing MMSE - Mini Mental State Exam 07/16/2015  Orientation to time 5  Orientation to Place 5  Registration 3  Attention/ Calculation 0  Recall 3  Language- name 2 objects 0  Language- repeat 1  Language- follow 3 step command 3  Language- read & follow direction 0  Write a sentence 0  Copy design 0  Total score 20  PLEASE NOTE: A Mini-Cog screen was completed. Maximum score is 20. A value of 0 denotes this part of Folstein MMSE was not completed.  Orientation to Time - Max 5 Orientation to Place - Max  5 Registration - Max 3 Recall - Max 3 Language Repeat - Max 1 Language Follow 3 Step Command - Max 3   Immunization History  Administered Date(s) Administered  . Influenza Whole 01/18/2009  . Influenza,inj,Quad PF,36+ Mos 01/15/2015  . Influenza-Unspecified 12/05/2013  . Pneumococcal Conjugate-13 01/15/2015  . Pneumococcal Polysaccharide-23 10/18/2012  . Tdap 12/04/2007  . Zoster 10/18/2012   Screening Tests Health Maintenance  Topic Date Due  . COLONOSCOPY  07/15/2028 (Originally 04/19/1997)  . INFLUENZA VACCINE  10/19/2015  . MAMMOGRAM  04/06/2016  . COLON CANCER SCREENING ANNUAL FOBT  04/29/2016  . TETANUS/TDAP  12/03/2017  . DEXA SCAN  04/06/2020  . ZOSTAVAX  Completed  . Hepatitis C Screening  Completed  . PNA vac Low Risk Adult  Completed      Plan:     I have personally reviewed and addressed the Medicare Annual Wellness questionnaire and have noted the following in the patient's chart:  A. Medical and social history B. Use of alcohol, tobacco or illicit drugs  C. Current medications and supplements D. Functional ability and status E.  Nutritional status F.  Physical activity G. Advance directives H. List of other physicians I.  Hospitalizations, surgeries, and ER visits in previous 12 months J.  Vitals K. Screenings to include hearing, vision, cognitive, depression L. Referrals and appointments - none  In addition, I have reviewed and discussed with patient certain preventive protocols, quality metrics, and best practice recommendations. A written personalized care plan for preventive services as well as general preventive health recommendations were provided to patient.  See attached scanned questionnaire for additional information.   Signed,   Randa Evens, MHA, BS, LPN Health Advisor 07/16/2015  I reviewed health advisor's note, was available for consultation, and agree with documentation and plan. Kerby Nora, MD

## 2015-07-16 NOTE — Assessment & Plan Note (Signed)
Stable control on levo 125 mg daily.

## 2015-07-28 DIAGNOSIS — M545 Low back pain: Secondary | ICD-10-CM | POA: Diagnosis not present

## 2015-08-02 DIAGNOSIS — M545 Low back pain: Secondary | ICD-10-CM | POA: Diagnosis not present

## 2015-08-05 DIAGNOSIS — M545 Low back pain: Secondary | ICD-10-CM | POA: Diagnosis not present

## 2015-08-13 DIAGNOSIS — M545 Low back pain: Secondary | ICD-10-CM | POA: Diagnosis not present

## 2015-08-16 DIAGNOSIS — M545 Low back pain: Secondary | ICD-10-CM | POA: Diagnosis not present

## 2015-08-19 DIAGNOSIS — H02135 Senile ectropion of left lower eyelid: Secondary | ICD-10-CM | POA: Diagnosis not present

## 2015-08-19 DIAGNOSIS — H02132 Senile ectropion of right lower eyelid: Secondary | ICD-10-CM | POA: Diagnosis not present

## 2015-08-19 DIAGNOSIS — M545 Low back pain: Secondary | ICD-10-CM | POA: Diagnosis not present

## 2015-08-23 DIAGNOSIS — M545 Low back pain: Secondary | ICD-10-CM | POA: Diagnosis not present

## 2015-08-30 DIAGNOSIS — M545 Low back pain: Secondary | ICD-10-CM | POA: Diagnosis not present

## 2015-10-03 ENCOUNTER — Other Ambulatory Visit: Payer: Self-pay | Admitting: Family Medicine

## 2015-10-03 NOTE — Telephone Encounter (Signed)
Last office visit 07/16/15.  Last refilled 07/16/15 for #30 with 1 refill.  Ok to refill?

## 2015-11-15 ENCOUNTER — Encounter: Payer: Self-pay | Admitting: *Deleted

## 2015-11-17 NOTE — Discharge Instructions (Signed)
INSTRUCTIONS FOLLOWING OCULOPLASTIC SURGERY °AMY M. FOWLER, MD ° °AFTER YOUR EYE SURGERY, THER ARE MANY THINGS THWIHC YOU, THE PATIENT, CAN DO TO ASSURE THE BEST POSSIBLE RESULT FROM YOUR OPERATION.  THIS SHEET SHOULD BE REFERRED TO WHENEVER QUESTIONS ARISE.  IF THERE ARE ANY QUESTIONS NOT ANSWERED HERE, DO NOT HESITATE TO CALL OUR OFFICE AT [phone] OR [phone].  THERE IS ALWAYS [facility] AVAILABLE TO CALL IF QUESTIONS OR PROBLEMS ARISE. ° °VISION: Your vision may be blurred and out of focus after surgery until you are able to stop using your ointment, swelling resolves and your eye(s) heal. This may take 1 to 2 weeks at the least.  If your vision becomes gradually more dim or dark, this is not normal and you need to call our office immediately. ° °EYE CARE: For the first 48 hours after surgery, use ice packs frequently - “20 minutes on, 20 minutes off” - to help reduce swelling and bruising.  Small bags of frozen peas or corn make good ice packs along with cloths soaked in ice water.  If you are wearing a patch or other type of dressing following surgery, keep this on for the amount of time specified by your doctor.  For the first week following surgery, you will need to treat your stitches with great care.  If is OK to shower, but take care to not allow soapy water to run into your eye(s) to help reduce changes of infection.  You may gently clean the eyelashes and around the eye(s) with cotton balls and sterile water, BUT DO NOT RUB THE STITCHES VIGOROUSLY.  Keeping your stitches moist with ointment will help promote healing with minimal scar formation. ° °ACTIVITY: When you leave the surgery center, you should go home, rest and be inactive.  The eye(s) may feel scratchy and keeping the eyes closed will allow for faster healing.  The first week following surgery, avoid straining (anything making the face turn red) or lifting over 20 pounds.  Additionally, avoid bending which causes your head to go below  your waist.  Using your eyes will NOT harm them, so feel free to read, watch television, use the computer, etc as desired.  Driving depends on each individual, so check with your doctor if you have questions about driving. ° °MEDICATIONS:  You will be given a prescription for an ointment to use 4 times a day on your stitches.  You can use the ointment in your eyes if they feel scratchy or irritated.  If you eyelid(s) don’t close completely when you sleep, put some ointment in your eyes before bedtime. ° °EMERGENCY: If you experience SEVERE EYE PAIN OR HEADACHE UNRELIEVED BY TYLENOL OR PERCOCET, NAUSEA OR VOMITING, WORSENING REDNESS, OR WORSENING VISION (ESPECIALLY VISION THAT WA INITIALLY BETTER) CALL [phone] OR [phone] DURING BUSINESS HOURS OR AFTER HOURS. ° °General Anesthesia, Adult, Care After °Refer to this sheet in the next few weeks. These instructions provide you with information on caring for yourself after your procedure. Your health care provider may also give you more specific instructions. Your treatment has been planned according to current medical practices, but problems sometimes occur. Call your health care provider if you have any problems or questions after your procedure. °WHAT TO EXPECT AFTER THE PROCEDURE °After the procedure, it is typical to experience: °· Sleepiness. °· Nausea and vomiting. °HOME CARE INSTRUCTIONS °· For the first 24 hours after general anesthesia: °¨ Have a responsible person with you. °¨ Do not drive a car. If you   are alone, do not take public transportation. °¨ Do not drink alcohol. °¨ Do not take medicine that has not been prescribed by your health care provider. °¨ Do not sign important papers or make important decisions. °¨ You may resume a normal diet and activities as directed by your health care provider. °· Change bandages (dressings) as directed. °· If you have questions or problems that seem related to general anesthesia, call the hospital and ask for  the anesthetist or anesthesiologist on call. °SEEK MEDICAL CARE IF: °· You have nausea and vomiting that continue the day after anesthesia. °· You develop a rash. °SEEK IMMEDIATE MEDICAL CARE IF:  °· You have difficulty breathing. °· You have chest pain. °· You have any allergic problems. °  °This information is not intended to replace advice given to you by your health care provider. Make sure you discuss any questions you have with your health care provider. °  °Document Released: 06/12/2000 Document Revised: 03/27/2014 Document Reviewed: 07/05/2011 °[employer]vier Interactive Patient Education ©2016 [employer] ° °

## 2015-11-23 ENCOUNTER — Ambulatory Visit: Payer: Medicare Other | Admitting: Student in an Organized Health Care Education/Training Program

## 2015-11-23 ENCOUNTER — Encounter
Admission: RE | Disposition: A | Discharge status: Home / Self Care | Payer: Self-pay | Source: Ambulatory Visit | Attending: Ophthalmology

## 2015-11-23 ENCOUNTER — Ambulatory Visit
Admission: RE | Admit: 2015-11-23 | Discharge: 2015-11-23 | Disposition: A | Discharge status: Home / Self Care | Payer: Medicare Other | Source: Ambulatory Visit | Attending: Ophthalmology | Admitting: Ophthalmology

## 2015-11-23 DIAGNOSIS — H02834 Dermatochalasis of left upper eyelid: Secondary | ICD-10-CM | POA: Diagnosis not present

## 2015-11-23 DIAGNOSIS — M1990 Unspecified osteoarthritis, unspecified site: Secondary | ICD-10-CM | POA: Diagnosis not present

## 2015-11-23 DIAGNOSIS — G8929 Other chronic pain: Secondary | ICD-10-CM | POA: Diagnosis not present

## 2015-11-23 DIAGNOSIS — H02105 Unspecified ectropion of left lower eyelid: Secondary | ICD-10-CM | POA: Insufficient documentation

## 2015-11-23 DIAGNOSIS — Z87891 Personal history of nicotine dependence: Secondary | ICD-10-CM | POA: Insufficient documentation

## 2015-11-23 DIAGNOSIS — Z79899 Other long term (current) drug therapy: Secondary | ICD-10-CM | POA: Insufficient documentation

## 2015-11-23 DIAGNOSIS — Z9889 Other specified postprocedural states: Secondary | ICD-10-CM | POA: Insufficient documentation

## 2015-11-23 DIAGNOSIS — H02831 Dermatochalasis of right upper eyelid: Secondary | ICD-10-CM | POA: Insufficient documentation

## 2015-11-23 DIAGNOSIS — E039 Hypothyroidism, unspecified: Secondary | ICD-10-CM | POA: Diagnosis not present

## 2015-11-23 DIAGNOSIS — H02102 Unspecified ectropion of right lower eyelid: Secondary | ICD-10-CM | POA: Insufficient documentation

## 2015-11-23 DIAGNOSIS — H02132 Senile ectropion of right lower eyelid: Secondary | ICD-10-CM | POA: Diagnosis not present

## 2015-11-23 DIAGNOSIS — H02403 Unspecified ptosis of bilateral eyelids: Secondary | ICD-10-CM | POA: Insufficient documentation

## 2015-11-23 DIAGNOSIS — Z882 Allergy status to sulfonamides status: Secondary | ICD-10-CM | POA: Diagnosis not present

## 2015-11-23 DIAGNOSIS — M549 Dorsalgia, unspecified: Secondary | ICD-10-CM | POA: Diagnosis not present

## 2015-11-23 DIAGNOSIS — H02135 Senile ectropion of left lower eyelid: Secondary | ICD-10-CM | POA: Diagnosis not present

## 2015-11-23 HISTORY — DX: Headache: R51

## 2015-11-23 HISTORY — PX: ECTROPION REPAIR: SHX357

## 2015-11-23 HISTORY — DX: Pericardial effusion (noninflammatory): I31.3

## 2015-11-23 HISTORY — DX: Reserved for inherently not codable concepts without codable children: IMO0001

## 2015-11-23 HISTORY — DX: Procedure and treatment not carried out because of patient's decision for reasons of belief and group pressure: Z53.1

## 2015-11-23 HISTORY — DX: Other intervertebral disc degeneration, lumbar region: M51.36

## 2015-11-23 HISTORY — DX: Other intervertebral disc degeneration, lumbar region without mention of lumbar back pain or lower extremity pain: M51.369

## 2015-11-23 HISTORY — PX: PTOSIS REPAIR: SHX6568

## 2015-11-23 HISTORY — PX: BROW LIFT: SHX178

## 2015-11-23 HISTORY — DX: Headache, unspecified: R51.9

## 2015-11-23 HISTORY — DX: Other pericardial effusion (noninflammatory): I31.39

## 2015-11-23 HISTORY — DX: Hypothyroidism, unspecified: E03.9

## 2015-11-23 HISTORY — DX: Unspecified osteoarthritis, unspecified site: M19.90

## 2015-11-23 SURGERY — BLEPHAROPLASTY
Anesthesia: Monitor Anesthesia Care | Laterality: Bilateral | Wound class: Clean

## 2015-11-23 MED ORDER — PROPOFOL 500 MG/50ML IV EMUL
INTRAVENOUS | Status: DC | PRN
  Administered 2015-11-23: 50 ug/kg/min via INTRAVENOUS

## 2015-11-23 MED ORDER — ERYTHROMYCIN 5 MG/GM OP OINT
TOPICAL_OINTMENT | OPHTHALMIC | Status: DC | PRN
  Administered 2015-11-23: 1 via OPHTHALMIC

## 2015-11-23 MED ORDER — OXYCODONE-ACETAMINOPHEN 5-325 MG PO TABS: 1.0000 | ORAL_TABLET | ORAL | 0 refills | Status: DC | PRN

## 2015-11-23 MED ORDER — LIDOCAINE-EPINEPHRINE 2 %-1:100000 IJ SOLN
INTRAMUSCULAR | Status: DC | PRN
  Administered 2015-11-23: 10 mL via OPHTHALMIC

## 2015-11-23 MED ORDER — ERYTHROMYCIN 5 MG/GM OP OINT: TOPICAL_OINTMENT | OPHTHALMIC | 3 refills | Status: DC

## 2015-11-23 MED ORDER — ALFENTANIL 500 MCG/ML IJ INJ
INJECTION | INTRAMUSCULAR | Status: DC | PRN
  Administered 2015-11-23: 200 ug via INTRAVENOUS
  Administered 2015-11-23: 300 ug via INTRAVENOUS
  Administered 2015-11-23: 500 ug via INTRAVENOUS

## 2015-11-23 MED ORDER — TETRACAINE HCL 0.5 % OP SOLN
OPHTHALMIC | Status: DC | PRN
  Administered 2015-11-23: 2 [drp] via OPHTHALMIC

## 2015-11-23 MED ORDER — LACTATED RINGERS IV SOLN: INTRAVENOUS | Status: DC

## 2015-11-23 MED ORDER — LACTATED RINGERS IV SOLN
INTRAVENOUS | Status: DC
  Administered 2015-11-23: 09:00:00 via INTRAVENOUS

## 2015-11-23 MED ORDER — MIDAZOLAM HCL 2 MG/2ML IJ SOLN
INTRAMUSCULAR | Status: DC | PRN
  Administered 2015-11-23: 2 mg via INTRAVENOUS

## 2015-11-23 SURGICAL SUPPLY — 38 items
APPLICATOR COTTON TIP WD 3 STR (MISCELLANEOUS) ×6 IMPLANT
BLADE SURG 15 STRL LF DISP TIS (BLADE) ×1 IMPLANT
BLADE SURG 15 STRL SS (BLADE) ×3
CORD BIP STRL DISP 12FT (MISCELLANEOUS) ×3 IMPLANT
DRAPE HEAD BAR (DRAPES) ×3 IMPLANT
GAUZE SPONGE 4X4 12PLY STRL (GAUZE/BANDAGES/DRESSINGS) ×3 IMPLANT
GAUZE SPONGE NON-WVN 2X2 STRL (MISCELLANEOUS) ×10 IMPLANT
GLOVE SURG LX 7.0 MICRO (GLOVE) ×4
GLOVE SURG LX STRL 7.0 MICRO (GLOVE) ×2 IMPLANT
MARKER SKIN XFINE TIP W/RULER (MISCELLANEOUS) ×3 IMPLANT
NDL FILTER BLUNT 18X1 1/2 (NEEDLE) ×1 IMPLANT
NDL HYPO 30X.5 LL (NEEDLE) ×2 IMPLANT
NEEDLE FILTER BLUNT 18X 1/2SAF (NEEDLE) ×2
NEEDLE FILTER BLUNT 18X1 1/2 (NEEDLE) ×1 IMPLANT
NEEDLE HYPO 30X.5 LL (NEEDLE) ×6 IMPLANT
PACK DRAPE NASAL/ENT (PACKS) ×3 IMPLANT
SOL PREP PVP 2OZ (MISCELLANEOUS) ×3
SOLUTION PREP PVP 2OZ (MISCELLANEOUS) ×1 IMPLANT
SPONGE VERSALON 2X2 STRL (MISCELLANEOUS) ×30
SUT CHROMIC 4-0 (SUTURE) ×3
SUT CHROMIC 4-0 M2 12X2 ARM (SUTURE) ×1
SUT CHROMIC 5 0 P 3 (SUTURE) IMPLANT
SUT ETHILON 4 0 CL P 3 (SUTURE) IMPLANT
SUT MERSILENE 4-0 S-2 (SUTURE) ×7 IMPLANT
SUT PDS AB 4-0 P3 18 (SUTURE) IMPLANT
SUT PLAIN GUT (SUTURE) ×5 IMPLANT
SUT PROLENE 5 0 P 3 (SUTURE) ×3 IMPLANT
SUT PROLENE 6 0 P 1 18 (SUTURE) ×3 IMPLANT
SUT SILK 4 0 G 3 (SUTURE) IMPLANT
SUT VIC AB 5-0 P-3 18X BRD (SUTURE) IMPLANT
SUT VIC AB 5-0 P3 18 (SUTURE)
SUT VICRYL 6-0  S14 CTD (SUTURE)
SUT VICRYL 6-0 S14 CTD (SUTURE) IMPLANT
SUT VICRYL 7 0 TG140 8 (SUTURE) IMPLANT
SUTURE CHRMC 4-0 M2 12X2 ARM (SUTURE) IMPLANT
SYR 3ML LL SCALE MARK (SYRINGE) ×3 IMPLANT
SYRINGE 10CC LL (SYRINGE) ×3 IMPLANT
WATER STERILE IRR 500ML POUR (IV SOLUTION) ×3 IMPLANT

## 2015-11-23 NOTE — Anesthesia Preprocedure Evaluation (Signed)
Anesthesia Evaluation  Patient identified by MRN, date of birth, ID band Patient awake    Reviewed: Allergy & Precautions, H&P , NPO status , Patient's Chart, lab work & pertinent test results, reviewed documented beta blocker date and time   Airway Mallampati: I  TM Distance: >3 FB Neck ROM: full    Dental no notable dental hx.    Pulmonary former smoker,    Pulmonary exam normal breath sounds clear to auscultation       Cardiovascular Exercise Tolerance: Good negative cardio ROS Normal cardiovascular exam Rhythm:regular Rate:Normal     Neuro/Psych  Headaches, negative psych ROS   GI/Hepatic negative GI ROS, Neg liver ROS,   Endo/Other  Hypothyroidism   Renal/GU negative Renal ROS  negative genitourinary   Musculoskeletal   Abdominal   Peds  Hematology negative hematology ROS (+)   Anesthesia Other Findings   Reproductive/Obstetrics negative OB ROS                             Anesthesia Physical Anesthesia Plan  ASA: II  Anesthesia Plan: MAC   Post-op Pain Management:    Induction:   Airway Management Planned:   Additional Equipment:   Intra-op Plan:   Post-operative Plan:   Informed Consent: I have reviewed the patients History and Physical, chart, labs and discussed the procedure including the risks, benefits and alternatives for the proposed anesthesia with the patient or authorized representative who has indicated his/her understanding and acceptance.     Plan Discussed with: CRNA  Anesthesia Plan Comments:         Anesthesia Quick Evaluation

## 2015-11-23 NOTE — Transfer of Care (Signed)
Immediate Anesthesia Transfer of Care Note  Patient: Stephanie JordanLinda C Kemp  Procedure(s) Performed: Procedure(s): BLEPHAROPLASTY, upper (Bilateral) PTOSIS REPAIR, upper (Bilateral) REPAIR OF ECTROPION, lower (Bilateral)  Patient Location: PACU  Anesthesia Type: MAC  Level of Consciousness: awake, alert  and patient cooperative  Airway and Oxygen Therapy: Patient Spontanous Breathing and Patient connected to supplemental oxygen  Post-op Assessment: Post-op Vital signs reviewed, Patient's Cardiovascular Status Stable, Respiratory Function Stable, Patent Airway and No signs of Nausea or vomiting  Post-op Vital Signs: Reviewed and stable  Complications: No apparent anesthesia complications

## 2015-11-23 NOTE — Interval H&P Note (Signed)
History and Physical Interval Note:  11/23/2015 9:41 AM  Stephanie JordanLinda C Kemp  has presented today for surgery, with the diagnosis of H02.831 H02.834 DERMATOCHALASIS UPPER H02.132 H02.135 ECTROPION SENILE OF EYELID LOWER H02.403 PTOTIS OF EYELID UNSPECFIED BILATERAL  The various methods of treatment have been discussed with the patient and family. After consideration of risks, benefits and other options for treatment, the patient has consented to  Procedure(s): BLEPHAROPLASTY (Bilateral) PTOSIS REPAIR (Bilateral) REPAIR OF ECTROPION (Bilateral) as a surgical intervention .  The patient's history has been reviewed, patient examined, no change in status, stable for surgery.  I have reviewed the patient's chart and labs.  Questions were answered to the patient's satisfaction.     Ether GriffinsFowler, Gayathri Futrell M

## 2015-11-23 NOTE — Anesthesia Postprocedure Evaluation (Signed)
Anesthesia Post Note  Patient: Stephanie JordanLinda C Kemp  Procedure(s) Performed: Procedure(s) (LRB): BLEPHAROPLASTY, upper (Bilateral) PTOSIS REPAIR, upper (Bilateral) REPAIR OF ECTROPION, lower (Bilateral)  Patient location during evaluation: PACU Anesthesia Type: MAC Level of consciousness: awake and alert Pain management: pain level controlled Vital Signs Assessment: post-procedure vital signs reviewed and stable Respiratory status: spontaneous breathing, nonlabored ventilation and respiratory function stable Cardiovascular status: stable and blood pressure returned to baseline Anesthetic complications: no    Alta CorningBacon, Kaeya Schiffer S

## 2015-11-23 NOTE — Op Note (Signed)
Preoperative Diagnosis:  1. Visually significant blepharoptosis both Upper Eyelid(s) 2. Visually significant dermatochalasis both Upper Eyelid(s) 3. Lower eyelid laxity with ectropion,  both  lower eyelid(s).  Postoperative Diagnosis:  Same.  Procedure(s) Performed:   1. Blepharoptosis repair with levator aponeurosis advancement bilateral Upper Eyelid(s) 2. Upper eyelid blepharoplasty with excess skin excision  bilateral Upper Eyelid(s) 3. Lateral tarsal strip procedure,  bilateral  lower eyelid(s).  Teaching Surgeon: Hubbard RobinsonAmy M. Ether GriffinsFowler, M.D.  Assistants: none  Anesthesia: MAC  Specimens: None.  Estimated Blood Loss: Minimal.  Complications: None.  Operative Findings: None Dictated  Procedure:   Allergies were reviewed and the patient is allergic to sulfonamide derivatives..    After the risks, benefits, complications and alternatives were discussed with the patient, appropriate informed consent was obtained.  While seated in an upright position and looking in primary gaze, the mid pupillary line was marked on the upper eyelid margins bilaterally. The patient was then brought to the operating suite and reclined supine.  Timeout was conducted and the patient was sedated.  Local anesthetic consisting of a 50-50 mixture of 2% lidocaine with epinephrine and 0.75% bupivacaine with added Hylenex was injected subcutaneously to both upper eyelid(s). Additional anesthetic was  injected subcutaneously to the bilateral lateral canthal region(s) and lower eyelid(s). Additional anesthetic was injected subconjunctivally to the bilateral lower eyelid(s). Finally, anesthetic was injected down to the periosteum of the bilateral lateral orbital rim(s). After adequate local was instilled, the patient was prepped and draped in the usual sterile fashion for eyelid surgery.   Attention was turned to the upper eyelids. A 9mm upper eyelid crease incision line was marked with calipers on both upper eyelid(s).   A pinch test was used to estimate the amount of excess skin to remove and this was marked in standard blepharoplasty style fashion. Attention was turned to the  right upper eyelid. A #15 blade was used to open the premarked incision line. A skin only flap was excised and hemostasis was obtained with bipolar cautery.    Westcott scissors were then used to transect through orbicularis down to the tarsal plate. Epitarsus was dissected to create a smooth surface to suture to. Dissection was then carried superiorly in the plane between orbicularis and orbital septum. Once the preaponeurotic fat pocket was identified, the orbital septum was opened. This revealed the levator and its aponeurosis.    Attention was then turned to the opposite eyelid where the same procedure was performed in the same manner. Hemostasis was obtained with bipolar cautery throughout.   3 interrupted 6-0 Prolene sutures were then passed partial thickness through the tarsal plates of both upper eyelid(s). These sutures were placed in line with the mid pupillary, medial limbal, and lateral limbal lines. The sutures were fixed to the levator aponeurosis and adjusted until a nice lid height and contour were achieved. Once nice symmetry was achieved, the skin incisions were closed with a running 6-0 fast absorbing plain suture.    Attention was turned to the left lateral canthal angle. Westcott scissors were used to create a lateral canthotomy. Hemostasis was obtained with bipolar cautery. An inferior cantholysis was then performed with additional bipolar hemostasis. The anterior and posterior lamella of the lid were divided for approximately 8 mm.  A strip of the epithelium was excised off the superior margin of the tarsal strip and conjunctiva and retractors were incised off the inferior margin of the tarsal strip. A double-armed 4-0 Mersilene suture was then passed each arm through the terminal  portion of the tarsal strip. Each arm of  the suture was then passed through the periosteum of the inner portion of the lateral orbital rim at the level of Whitnall's tubercle. The sutures were advanced and this provided nice elevation and tightening of the lower eyelid. Once the suture was secured, a thin strip of follicle-bearing skin was excised. The lateral canthal angle was reformed with an interrupted 6-0 fast absorbing plain suture. Orbicularis was reapproximated with horizontal subcuticular 6-0 fast absorbing plain gut sutures. The skin was closed with interrupted 6-0 fast absorbing plain gut sutures.   Attention was then turned to the opposite eyelid where the same procedure was performed in the same manner.   The patient tolerated the procedure well.  Erythromycin ophthalmic ointment was applied to the incision site(s) followed by ice packs. The patient was taken to the recovery area where she recovered without difficulty.  Post-Op Plan/Instructions:  The patient was instructed to use ice packs frequently for the next 48 hours. She was instructed to use erythromycin ophthalmic ointment on Her incisions 4 times a day for the next 12 to 14 days. She was given a prescription for Percocet for pain control should Tylenol not be effective. She was asked to follow up at the [facility] in [place], [place]  in 2 weeks' time or sooner as needed for problems.   Stephanie Kemp M. Ether Griffins, M.D. Attending,Ophthalmology

## 2015-11-23 NOTE — Anesthesia Procedure Notes (Signed)
Procedure Name: MAC Performed by: Deon Ivey Pre-anesthesia Checklist: Patient identified, Emergency Drugs available, Suction available, Timeout performed and Patient being monitored Patient Re-evaluated:Patient Re-evaluated prior to inductionOxygen Delivery Method: Nasal cannula Placement Confirmation: positive ETCO2     

## 2015-11-23 NOTE — H&P (Signed)
  See the history and physical completed at [facility] on 11/05/15 and scanned into the chart.  

## 2015-11-24 ENCOUNTER — Encounter: Payer: Self-pay | Admitting: Ophthalmology

## 2016-01-03 ENCOUNTER — Telehealth: Payer: Self-pay | Admitting: Family Medicine

## 2016-01-03 NOTE — Telephone Encounter (Signed)
Pt called wanting to get a pneumonia shot.  Is it ok to schedule

## 2016-01-03 NOTE — Telephone Encounter (Signed)
Patient is not due for any pneumonia shots.  She had her  Pneumovax in 2014 and Prevnar 2016.  That is all she needs ever.  Looks like she just needs her flu shot.

## 2016-01-04 NOTE — Telephone Encounter (Signed)
Left message asking pt to call office  °

## 2016-01-05 NOTE — Telephone Encounter (Signed)
Pt called, advised of information, pt verbalized understanding

## 2016-02-28 ENCOUNTER — Other Ambulatory Visit: Payer: Self-pay | Admitting: Family Medicine

## 2016-02-28 NOTE — Telephone Encounter (Signed)
Last office visit 07/16/2015.  Meloxicam not on current medication list.  Refill?

## 2016-03-21 ENCOUNTER — Telehealth: Payer: Self-pay | Admitting: Family Medicine

## 2016-03-21 ENCOUNTER — Other Ambulatory Visit: Payer: Self-pay | Admitting: Family Medicine

## 2016-03-21 DIAGNOSIS — E559 Vitamin D deficiency, unspecified: Secondary | ICD-10-CM

## 2016-03-21 DIAGNOSIS — E038 Other specified hypothyroidism: Secondary | ICD-10-CM

## 2016-03-21 DIAGNOSIS — E7800 Pure hypercholesterolemia, unspecified: Secondary | ICD-10-CM

## 2016-03-21 NOTE — Telephone Encounter (Signed)
-----   Message from Alvina Chouerri J Walsh sent at 03/17/2016  8:05 AM EST ----- Regarding: Lab orders for Wednesday 1.10.18 Patient is scheduled for CPX labs, please order future labs, Thanks , Camelia Engerri

## 2016-03-22 ENCOUNTER — Telehealth: Payer: Self-pay | Admitting: *Deleted

## 2016-03-22 NOTE — Telephone Encounter (Signed)
[facility].  [facility] notified via fax that it is okay to switch manufacturers per Dr. Ermalene SearingBedsole.   [name] also notified of the change and advised that after starting the new levothyroxine that if she notices that she is not feeling well, to come in and have her TSH level checked in 2 to 4 weeks.  Patient states understanding.

## 2016-03-24 ENCOUNTER — Other Ambulatory Visit: Payer: Self-pay | Admitting: Family Medicine

## 2016-03-29 ENCOUNTER — Other Ambulatory Visit: Payer: Medicare Other

## 2016-03-31 ENCOUNTER — Encounter: Payer: Medicare Other | Admitting: Family Medicine

## 2016-05-02 ENCOUNTER — Other Ambulatory Visit (INDEPENDENT_AMBULATORY_CARE_PROVIDER_SITE_OTHER): Payer: Medicare Other

## 2016-05-02 DIAGNOSIS — E7800 Pure hypercholesterolemia, unspecified: Secondary | ICD-10-CM

## 2016-05-02 DIAGNOSIS — E559 Vitamin D deficiency, unspecified: Secondary | ICD-10-CM | POA: Diagnosis not present

## 2016-05-02 DIAGNOSIS — E038 Other specified hypothyroidism: Secondary | ICD-10-CM

## 2016-05-02 LAB — COMPREHENSIVE METABOLIC PANEL
ALBUMIN: 4.1 g/dL (ref 3.5–5.2)
ALT: 14 U/L (ref 0–35)
AST: 18 U/L (ref 0–37)
Alkaline Phosphatase: 38 U/L — ABNORMAL LOW (ref 39–117)
BUN: 16 mg/dL (ref 6–23)
CALCIUM: 9.4 mg/dL (ref 8.4–10.5)
CHLORIDE: 103 meq/L (ref 96–112)
CO2: 31 mEq/L (ref 19–32)
Creatinine, Ser: 1.16 mg/dL (ref 0.40–1.20)
GFR: 49.23 mL/min — ABNORMAL LOW (ref >60.00)
Glucose, Bld: 89 mg/dL (ref 70–99)
POTASSIUM: 4 meq/L (ref 3.5–5.1)
Sodium: 139 mEq/L (ref 135–145)
Total Bilirubin: 0.4 mg/dL (ref 0.2–1.2)
Total Protein: 6.8 g/dL (ref 6.0–8.3)

## 2016-05-02 LAB — LIPID PANEL
CHOL/HDL RATIO: 3
CHOLESTEROL: 251 mg/dL — AB (ref 0–200)
HDL: 76.4 mg/dL (ref >39.00)
LDL Cholesterol: 157 mg/dL — ABNORMAL HIGH (ref 0–99)
NONHDL: 174.38
TRIGLYCERIDES: 86 mg/dL (ref 0.0–149.0)
VLDL: 17.2 mg/dL (ref 0.0–40.0)

## 2016-05-02 LAB — T3, FREE: T3 FREE: 2.8 pg/mL (ref 2.3–4.2)

## 2016-05-02 LAB — VITAMIN D 25 HYDROXY (VIT D DEFICIENCY, FRACTURES): VITD: 21.12 ng/mL — AB (ref 30.00–100.00)

## 2016-05-02 LAB — TSH: TSH: 17.59 u[IU]/mL — AB (ref 0.35–4.50)

## 2016-05-02 LAB — T4, FREE: FREE T4: 0.75 ng/dL (ref 0.60–1.60)

## 2016-05-05 ENCOUNTER — Encounter: Payer: Self-pay | Admitting: Family Medicine

## 2016-05-05 ENCOUNTER — Ambulatory Visit (INDEPENDENT_AMBULATORY_CARE_PROVIDER_SITE_OTHER): Payer: Medicare Other | Admitting: Family Medicine

## 2016-05-05 VITALS — BP 122/84 | HR 71 | Temp 98.7°F | Ht 64.0 in | Wt 167.5 lb

## 2016-05-05 DIAGNOSIS — E7800 Pure hypercholesterolemia, unspecified: Secondary | ICD-10-CM | POA: Diagnosis not present

## 2016-05-05 DIAGNOSIS — N3946 Mixed incontinence: Secondary | ICD-10-CM | POA: Diagnosis not present

## 2016-05-05 DIAGNOSIS — N289 Disorder of kidney and ureter, unspecified: Secondary | ICD-10-CM

## 2016-05-05 DIAGNOSIS — E038 Other specified hypothyroidism: Secondary | ICD-10-CM | POA: Diagnosis not present

## 2016-05-05 DIAGNOSIS — E559 Vitamin D deficiency, unspecified: Secondary | ICD-10-CM

## 2016-05-05 MED ORDER — VITAMIN D (ERGOCALCIFEROL) 1.25 MG (50000 UNIT) PO CAPS
50000.0000 [IU] | ORAL_CAPSULE | ORAL | 0 refills | Status: DC
Start: 2016-05-05 — End: 2016-07-25

## 2016-05-05 MED ORDER — LEVOTHYROXINE SODIUM 137 MCG PO TABS: 125.0000 ug | ORAL_TABLET | Freq: Every day | ORAL | 11 refills | Status: DC

## 2016-05-05 NOTE — Assessment & Plan Note (Signed)
Direct thyroid hormone trending low and TSH up. Will increase levothyroxine  A small amount and recheck in 4 weeks.

## 2016-05-05 NOTE — Progress Notes (Signed)
Pre visit review using our clinic review tool, if applicable. No additional management support is needed unless otherwise documented below in the visit note. 

## 2016-05-05 NOTE — Patient Instructions (Addendum)
Make appt with Concha NorwayLeisa Pinson, RN for  Annual Medicare Wellness after 07/15/2016.  Hold meloxicam and naprosyn as much as able given slight decrease in  Kidney function.  Use tylenol instead. Drink lots of fluids. Restart cholesterol medication.

## 2016-05-05 NOTE — Progress Notes (Signed)
Subjective:    Patient ID: Stephanie Kemp, female    DOB: July 05, 1945, 71 y.o.   MRN: [id number]  HPI   71 year old female presents for medicare wellness, but it is to early for this visit.Marland Kitchen She will reschedule with Concha Norway after 07/15/2016.   Today we will review chronic health issues as well as her recent labs.  Hypothyroidism  Lab Results  Component Value Date   TSH 17.59 (H) 05/02/2016     Elevated Cholesterol:  Poor control.. She has not been taking the chol medication. 20 mg atorvastatin. Lab Results  Component Value Date   CHOL 251 (H) 05/02/2016   HDL 76.40 05/02/2016   LDLCALC 157 (H) 05/02/2016   LDLDIRECT 145.3 10/11/2012   TRIG 86.0 05/02/2016   CHOLHDL 3 05/02/2016  Using medications without problems: none Muscle aches: None Diet compliance: healthy Exercise: treadmill, walking.Marland Kitchen No chest pain with this Other complaints:   Wt Readings from Last 3 Encounters:  05/05/16 167 lb 8 oz (76 kg)  11/23/15 166 lb (75.3 kg)  07/16/15 173 lb 8 oz (78.7 kg)  Body mass index is 28.75 kg/m.   vit d def: low, needs to be repleted, taking daily vit D 400 iu BOID.   Blood pressure 122/84, pulse 71, temperature 98.7 F (37.1 C), temperature source Oral, height 5\' 4"  (1.626 m), weight 167 lb 8 oz (76 kg).  Social History /Family History/Past Medical History reviewed and updated if needed.   Review of Systems  Constitutional: Negative for fatigue and fever.  HENT: Negative for congestion.   Eyes: Negative for pain.  Respiratory: Negative for cough and shortness of breath.   Cardiovascular: Negative for chest pain, palpitations and leg swelling.       Had some indigestion after eating/ at rest.. Nonexertional.  Tried tums. No associated sweating, SOB etc. Went away with time and aspirin.  Gastrointestinal: Negative for abdominal pain.  Genitourinary: Positive for urgency. Negative for dysuria and vaginal bleeding.       Stress incontinence, small amount of loss  with straining, sneezing , cough.. Wears a pad.  mild leakage if cannot get to bathroom.  Musculoskeletal: Negative for back pain.  Neurological: Negative for syncope, light-headedness and headaches.  Psychiatric/Behavioral: Negative for dysphoric mood.       Objective:   Physical Exam  Constitutional: Vital signs are normal. She appears well-developed and well-nourished. She is cooperative.  Non-toxic appearance. She does not appear ill. No distress.  HENT:  Head: Normocephalic.  Right Ear: Hearing, tympanic membrane, external ear and ear canal normal.  Left Ear: Hearing, tympanic membrane, external ear and ear canal normal.  Nose: Nose normal.  Eyes: Conjunctivae, EOM and lids are normal. Pupils are equal, round, and reactive to light. Lids are everted and swept, no foreign bodies found.  Neck: Trachea normal and normal range of motion. Neck supple. Carotid bruit is not present. No thyroid mass and no thyromegaly present.  Cardiovascular: Normal rate, regular rhythm, S1 normal, S2 normal, normal heart sounds and intact distal pulses.  Exam reveals no gallop.   No murmur heard. Pulmonary/Chest: Effort normal and breath sounds normal. No respiratory distress. She has no wheezes. She has no rhonchi. She has no rales.  Abdominal: Soft. Normal appearance and bowel sounds are normal. She exhibits no distension, no fluid wave, no abdominal bruit and no mass. There is no hepatosplenomegaly. There is no tenderness. There is no rebound, no guarding and no CVA tenderness. No hernia.  Lymphadenopathy:  She has no cervical adenopathy.    She has no axillary adenopathy.  Neurological: She is alert. She has normal strength. No cranial nerve deficit or sensory deficit.  Skin: Skin is warm, dry and intact. No rash noted.  Psychiatric: Her speech is normal and behavior is normal. Judgment normal. Her mood appears not anxious. Cognition and memory are normal. She does not exhibit a depressed mood.           Assessment & Plan:

## 2016-05-05 NOTE — Assessment & Plan Note (Signed)
Replace x 12 weeks.

## 2016-05-05 NOTE — Assessment & Plan Note (Signed)
Not bothering her enough to use a medication. Wearing pad. Doing kegel's

## 2016-05-05 NOTE — Assessment & Plan Note (Signed)
Poor control off chol med.. Restart. Encouraged exercise, weight loss, healthy eating habits.  

## 2016-05-08 ENCOUNTER — Other Ambulatory Visit: Payer: Self-pay | Admitting: *Deleted

## 2016-05-08 MED ORDER — ATORVASTATIN CALCIUM 20 MG PO TABS: 20.0000 mg | ORAL_TABLET | Freq: Every day | ORAL | 3 refills | Status: DC

## 2016-06-02 ENCOUNTER — Other Ambulatory Visit (INDEPENDENT_AMBULATORY_CARE_PROVIDER_SITE_OTHER): Payer: Medicare Other

## 2016-06-02 DIAGNOSIS — N289 Disorder of kidney and ureter, unspecified: Secondary | ICD-10-CM

## 2016-06-02 DIAGNOSIS — E038 Other specified hypothyroidism: Secondary | ICD-10-CM | POA: Diagnosis not present

## 2016-06-02 LAB — BASIC METABOLIC PANEL
BUN: 20 mg/dL (ref 6–23)
CHLORIDE: 106 meq/L (ref 96–112)
CO2: 28 mEq/L (ref 19–32)
Calcium: 9.3 mg/dL (ref 8.4–10.5)
Creatinine, Ser: 1.06 mg/dL (ref 0.40–1.20)
GFR: 54.61 mL/min — ABNORMAL LOW (ref >60.00)
Glucose, Bld: 93 mg/dL (ref 70–99)
POTASSIUM: 4 meq/L (ref 3.5–5.1)
SODIUM: 139 meq/L (ref 135–145)

## 2016-06-02 LAB — TSH: TSH: 3.61 u[IU]/mL (ref 0.35–4.50)

## 2016-06-02 LAB — T4, FREE: Free T4: 1.05 ng/dL (ref 0.60–1.60)

## 2016-06-02 LAB — T3, FREE: T3, Free: 3.6 pg/mL (ref 2.3–4.2)

## 2016-06-05 ENCOUNTER — Telehealth: Payer: Self-pay | Admitting: Family Medicine

## 2016-06-05 NOTE — Telephone Encounter (Signed)
Error

## 2016-06-05 NOTE — Telephone Encounter (Signed)
[name] notified by telephone that her kidney function is improving and her thyroid levels were normal.

## 2016-06-05 NOTE — Telephone Encounter (Signed)
Pt returned call about labs Thanks

## 2016-06-05 NOTE — Telephone Encounter (Signed)
Pt returned call

## 2016-07-03 ENCOUNTER — Telehealth: Payer: Self-pay

## 2016-07-03 NOTE — Telephone Encounter (Signed)
Pt left v/m requesting refill of levothyroxine 137 mcg to [facility] [address]. Per DPR left v/m for pt to have [facility] and have rx transferred.

## 2016-07-20 ENCOUNTER — Encounter: Payer: Self-pay | Admitting: *Deleted

## 2016-07-20 ENCOUNTER — Ambulatory Visit (INDEPENDENT_AMBULATORY_CARE_PROVIDER_SITE_OTHER): Payer: Medicare Other

## 2016-07-20 VITALS — BP 122/84 | HR 74 | Temp 98.5°F | Ht 64.0 in | Wt 171.0 lb

## 2016-07-20 DIAGNOSIS — E7800 Pure hypercholesterolemia, unspecified: Secondary | ICD-10-CM

## 2016-07-20 DIAGNOSIS — Z0000 Encounter for general adult medical examination without abnormal findings: Secondary | ICD-10-CM

## 2016-07-20 LAB — CBC WITH DIFFERENTIAL/PLATELET
BASOS PCT: 1.1 % (ref 0.0–3.0)
Basophils Absolute: 0.1 10*3/uL (ref 0.0–0.1)
EOS ABS: 0.3 10*3/uL (ref 0.0–0.7)
EOS PCT: 6.1 % — AB (ref 0.0–5.0)
HEMATOCRIT: 40.7 % (ref 36.0–46.0)
HEMOGLOBIN: 13.5 g/dL (ref 12.0–15.0)
LYMPHS PCT: 36.5 % (ref 12.0–46.0)
Lymphs Abs: 1.7 10*3/uL (ref 0.7–4.0)
MCHC: 33.1 g/dL (ref 30.0–36.0)
MCV: 93.7 fl (ref 78.0–100.0)
MONO ABS: 0.4 10*3/uL (ref 0.1–1.0)
Monocytes Relative: 8.2 % (ref 3.0–12.0)
NEUTROS PCT: 48.1 % (ref 43.0–77.0)
Neutro Abs: 2.2 10*3/uL (ref 1.4–7.7)
PLATELETS: 277 10*3/uL (ref 150.0–400.0)
RBC: 4.34 Mil/uL (ref 3.87–5.11)
RDW: 13.5 % (ref 11.5–15.5)
WBC: 4.7 10*3/uL (ref 4.0–10.5)

## 2016-07-20 NOTE — Patient Instructions (Signed)
[name] , Thank you for taking time to come for your Medicare Wellness Visit. I appreciate your ongoing commitment to your health goals. Please review the following plan we discussed and let me know if I can assist you in the future.   These are the goals we discussed: Goals    . Increase physical activity          When tolerated, I will continue walking at least 6000-7000 steps per day.        This is a list of the screening recommended for you and due dates:  Health Maintenance  Topic Date Due  . Mammogram  04/05/2017*  . Stool Blood Test  04/28/2017*  . Flu Shot  10/18/2016  . Tetanus Vaccine  12/03/2017  . DEXA scan (bone density measurement)  04/06/2020  .  Hepatitis C: One time screening is recommended by Center for Disease Control  (CDC) for  adults born from [birth year] through 1965.   Completed  . Pneumonia vaccines  Completed  *Topic was postponed. The date shown is not the original due date.   Preventive Care for Adults  A healthy lifestyle and preventive care can promote health and wellness. Preventive health guidelines for adults include the following key practices.  . A routine yearly physical is a good way to check with your health care provider about your health and preventive screening. It is a chance to share any concerns and updates on your health and to receive a thorough exam.  . Visit your dentist for a routine exam and preventive care every 6 months. Brush your teeth twice a day and floss once a day. Good oral hygiene prevents tooth decay and gum disease.  . The frequency of eye exams is based on your age, health, family medical history, use  of contact lenses, and other factors. Follow your health care provider's ecommendations for frequency of eye exams.  . Eat a healthy diet. Foods like vegetables, fruits, whole grains, low-fat dairy products, and lean protein foods contain the nutrients you need without too many calories. Decrease your intake of foods high  in solid fats, added sugars, and salt. Eat the right amount of calories for you. Get information about a proper diet from your health care provider, if necessary.  . Regular physical exercise is one of the most important things you can do for your health. Most adults should get at least 150 minutes of moderate-intensity exercise (any activity that increases your heart rate and causes you to sweat) each week. In addition, most adults need muscle-strengthening exercises on 2 or more days a week.  Silver Sneakers may be a benefit available to you. To determine eligibility, you may visit the website: www.silversneakers.com or contact program at [phone] [phone] Mon-Fri between 8AM-8PM.   . Maintain a healthy weight. The body mass index (BMI) is a screening tool to identify possible weight problems. It provides an estimate of body fat based on height and weight. Your health care provider can find your BMI and can help you achieve or maintain a healthy weight.   For adults 20 years and older: ? A BMI below 18.5 is considered underweight. ? A BMI of 18.5 to 24.9 is normal. ? A BMI of 25 to 29.9 is considered overweight. ? A BMI of 30 and above is considered obese.   . Maintain normal blood lipids and cholesterol levels by exercising and minimizing your intake of saturated fat. Eat a balanced diet with plenty of fruit and vegetables. Blood  tests for lipids and cholesterol should begin at age 59 and be repeated every 5 years. If your lipid or cholesterol levels are high, you are over 50, or you are at high risk for heart disease, you may need your cholesterol levels checked more frequently. Ongoing high lipid and cholesterol levels should be treated with medicines if diet and exercise are not working.  . If you smoke, find out from your health care provider how to quit. If you do not use tobacco, please do not start.  . If you choose to drink alcohol, please do not consume more than 2 drinks per day. One  drink is considered to be 12 ounces (355 mL) of beer, 5 ounces (148 mL) of wine, or 1.5 ounces (44 mL) of liquor.  . If you are 58-26 years old, ask your health care provider if you should take aspirin to prevent strokes.  . Use sunscreen. Apply sunscreen liberally and repeatedly throughout the day. You should seek shade when your shadow is shorter than you. Protect yourself by wearing long sleeves, pants, a wide-brimmed hat, and sunglasses year round, whenever you are outdoors.  . Once a month, do a whole body skin exam, using a mirror to look at the skin on your back. Tell your health care provider of new moles, moles that have irregular borders, moles that are larger than a pencil eraser, or moles that have changed in shape or color.

## 2016-07-20 NOTE — Progress Notes (Signed)
Pre visit review using our clinic review tool, if applicable. No additional management support is needed unless otherwise documented below in the visit note. 

## 2016-07-20 NOTE — Progress Notes (Signed)
Subjective:   Stephanie Kemp is a 71 y.o. female who presents for Medicare Annual (Subsequent) preventive examination.  Review of Systems:  N/A Cardiac Risk Factors include: advanced age (>38men, >52 women);dyslipidemia     Objective:     Vitals: BP 122/84 (BP Location: Right Arm, Patient Position: Sitting, Cuff Size: Normal)   Pulse 74   Temp 98.5 F (36.9 C) (Oral)   Ht 5\' 4"  (1.626 m) Comment: no shoes  Wt 171 lb (77.6 kg)   SpO2 99%   BMI 29.35 kg/m   Body mass index is 29.35 kg/m.   Tobacco History  Smoking Status  . Former Smoker  Smokeless Tobacco  . Never Used    Comment: smoked for several years after high school     Counseling given: No   Past Medical History:  Diagnosis Date  . Arthritis    lower back, left hip  . Degenerative disc disease, lumbar   . Headache    migraines.  none in several yrs  . Hypothyroidism   . Pericardial effusion    approx 2010. Unknown etiology. resolved.  . Refusal of blood transfusions as patient is Jehovah's Witness   . S/P dilatation and curettage    Past Surgical History:  Procedure Laterality Date  . BROW LIFT Bilateral 11/23/2015   Procedure: BLEPHAROPLASTY, upper;  Surgeon: Imagene Riches, MD;  Location: [facility];  Service: Ophthalmology;  Laterality: Bilateral;  . DILATION AND CURETTAGE OF UTERUS    . ECTROPION REPAIR Bilateral 11/23/2015   Procedure: REPAIR OF ECTROPION, lower;  Surgeon: Imagene Riches, MD;  Location: [facility];  Service: Ophthalmology;  Laterality: Bilateral;  . PTOSIS REPAIR Bilateral 11/23/2015   Procedure: PTOSIS REPAIR, upper;  Surgeon: Imagene Riches, MD;  Location: [facility];  Service: Ophthalmology;  Laterality: Bilateral;  . REPAIR NONUNION / DEFECT OF RADIUS / ULNA     Repair Right   Family History  Problem Relation Age of Onset  . Heart disease Mother     MI age 47's  . Parkinsonism Mother     ?  Marland Kitchen Cancer Brother     small cell lung cancer  . Mental  illness Other     Bipolar  . Cancer Maternal Aunt     breast  . Breast cancer Maternal Aunt   . Cancer Maternal Grandmother     breast cancer  . Breast cancer Maternal Grandmother    History  Sexual Activity  . Sexual activity: No    Outpatient Encounter Prescriptions as of 07/20/2016  Medication Sig  . atorvastatin (LIPITOR) 20 MG tablet Take 1 tablet (20 mg total) by mouth daily.  . B Complex-Biotin-FA (B-COMPLEX PO) Take 1 tablet by mouth daily.  . Calcium Citrate-Vitamin D (CALCIUM + D PO) Take 1 capsule by mouth 2 (two) times daily.  Marland Kitchen CINNAMON PO Take 2 capsules by mouth daily.  . diphenhydramine-acetaminophen (TYLENOL PM) 25-500 MG TABS tablet Take 1 tablet by mouth at bedtime as needed.  Marland Kitchen erythromycin [facility]) ophthalmic ointment Use a small amount on your sutures 4 times a day for the next 2 weeks. Switch to Aquaphor ointment should allergy develop.  Marland Kitchen levothyroxine (SYNTHROID, LEVOTHROID) 137 MCG tablet Take 1 tablet (137 mcg total) by mouth daily.  . Menthol, Topical Analgesic, (ICY HOT PAIN RELIEVING) 2.5 % GEL Apply topically as directed.  . Multiple Vitamins-Minerals (WOMENS MULTIVITAMIN PO) Take 1 tablet by mouth daily.  . Omega-3 Fatty Acids (FISH OIL PO)  Take by mouth daily.  . Vitamin D, Ergocalciferol, (DRISDOL) 50000 units CAPS capsule Take 1 capsule (50,000 Units total) by mouth every 7 (seven) days.  Marland Kitchen oxyCODONE-acetaminophen (PERCOCET) 5-325 MG tablet Take 1 tablet by mouth every 4 (four) hours as needed for severe pain. (Patient not taking: Reported on 07/20/2016)  . [DISCONTINUED] meloxicam (MOBIC) 15 MG tablet TAKE ONE TABLET BY MOUTH ONCE DAILY  . [DISCONTINUED] naproxen sodium (ANAPROX) 220 MG tablet Take 220 mg by mouth as needed.   No facility-administered encounter medications on file as of 07/20/2016.     Activities of Daily Living In your present state of health, do you have any difficulty performing the following activities: 07/20/2016 11/23/2015    Hearing? N N  Vision? N N  Difficulty concentrating or making decisions? N N  Walking or climbing stairs? N N  Dressing or bathing? N N  Doing errands, shopping? N -  Preparing Food and eating ? N -  Using the Toilet? N -  In the past six months, have you accidently leaked urine? Y -  Do you have problems with loss of bowel control? N -  Managing your Medications? N -  Managing your Finances? N -  Housekeeping or managing your Housekeeping? N -  Some recent data might be hidden    Patient Care Team: Excell Seltzer, MD as PCP - General    Assessment:    Hearing Screening Comments: Last hearing exam in Feb 2018 Vision Screening Comments: Last vision exam in Spring 2017   Exercise Activities and Dietary recommendations Current Exercise Habits: Home exercise routine, Type of exercise: walking (2K-3K steps daily), Exercise limited by: orthopedic condition(s)  Goals    . Increase physical activity          When tolerated, I will continue walking at least 6000-7000 steps per day.       Fall Risk Fall Risk  07/20/2016 05/05/2016 07/16/2015 07/16/2015 02/18/2014  Falls in the past year? Yes Yes No No No  Number falls in past yr: 1 1 - - -  Injury with Fall? Yes No - - -   Depression Screen PHQ 2/9 Scores 07/20/2016 05/05/2016 07/16/2015 07/16/2015  PHQ - 2 Score 0 0 0 0     Cognitive Function MMSE - Mini Mental State Exam 07/20/2016 07/16/2015  Orientation to time 5 5  Orientation to Place 5 5  Registration 3 3  Attention/ Calculation 0 0  Recall 2 3  Recall-comments pt was unable to recall 1 of 3 words -  Language- name 2 objects 0 0  Language- repeat 1 1  Language- follow 3 step command 3 3  Language- read & follow direction 0 0  Write a sentence 0 0  Copy design 0 0  Total score 19 20     PLEASE NOTE: A Mini-Cog screen was completed. Maximum score is 20. A value of 0 denotes this part of Folstein MMSE was not completed or the patient failed this part of the Mini-Cog  screening.   Mini-Cog Screening Orientation to Time - Max 5 pts Orientation to Place - Max 5 pts Registration - Max 3 pts Recall - Max 3 pts Language Repeat - Max 1 pts Language Follow 3 Step Command - Max 3 pts   Immunization History  Administered Date(s) Administered  . Influenza Whole 01/18/2009  . Influenza, High Dose Seasonal PF 12/01/2015  . Influenza,inj,Quad PF,36+ Mos 01/15/2015  . Influenza-Unspecified 12/05/2013  . Pneumococcal Conjugate-13 01/15/2015  . Pneumococcal  Polysaccharide-23 10/18/2012  . Tdap 12/04/2007  . Zoster 10/18/2012   Screening Tests Health Maintenance  Topic Date Due  . MAMMOGRAM  04/05/2017 (Originally 04/06/2016)  . COLON CANCER SCREENING ANNUAL FOBT  04/28/2017 (Originally 04/29/2016)  . INFLUENZA VACCINE  10/18/2016  . TETANUS/TDAP  12/03/2017  . DEXA SCAN  04/06/2020  . Hepatitis C Screening  Completed  . PNA vac Low Risk Adult  Completed      Plan:     I have personally reviewed and addressed the Medicare Annual Wellness questionnaire and have noted the following in the patient's chart:  A. Medical and social history B. Use of alcohol, tobacco or illicit drugs  C. Current medications and supplements D. Functional ability and status E.  Nutritional status F.  Physical activity G. Advance directives H. List of other physicians I.  Hospitalizations, surgeries, and ER visits in previous 12 months J.  Vitals K. Screenings to include hearing, vision, cognitive, depression L. Referrals and appointments - none  In addition, I have reviewed and discussed with patient certain preventive protocols, quality metrics, and best practice recommendations. A written personalized care plan for preventive services as well as general preventive health recommendations were provided to patient.  See attached scanned questionnaire for additional information.   Signed,   Randa EvensLesia Pinson, MHA, BS, LPN Health Coach

## 2016-07-20 NOTE — Progress Notes (Signed)
I reviewed health advisor's note, was available for consultation, and agree with documentation and plan.  

## 2016-07-20 NOTE — Progress Notes (Signed)
PCP notes:   Health maintenance:  Mammogram - addressed; pt plans to schedule future appt Colon cancer screening - addressed; FOBT kit provided to pt  Abnormal screenings:   Fall risk - hx of fall with injury Mini-Cog score: 19/20  Patient concerns:   Lower back pain. Pain scale: 3/10.  Nurse concerns:  None  Next PCP appt:   07/28/16 @ 1000

## 2016-07-25 ENCOUNTER — Other Ambulatory Visit: Payer: Self-pay | Admitting: Family Medicine

## 2016-07-25 NOTE — Telephone Encounter (Signed)
Last office visit 07/20/2016 with Mick SellLisa Pinson.  Last Vit D level 05/02/2016 which was low at 21.12 ng/ml.  Ok to refill?

## 2016-07-28 ENCOUNTER — Ambulatory Visit (INDEPENDENT_AMBULATORY_CARE_PROVIDER_SITE_OTHER): Payer: Medicare Other | Admitting: Family Medicine

## 2016-07-28 ENCOUNTER — Encounter: Payer: Self-pay | Admitting: Family Medicine

## 2016-07-28 VITALS — BP 120/70 | HR 81 | Temp 98.9°F | Ht 64.0 in | Wt 170.2 lb

## 2016-07-28 DIAGNOSIS — E038 Other specified hypothyroidism: Secondary | ICD-10-CM

## 2016-07-28 DIAGNOSIS — M545 Low back pain: Secondary | ICD-10-CM | POA: Diagnosis not present

## 2016-07-28 DIAGNOSIS — Z1211 Encounter for screening for malignant neoplasm of colon: Secondary | ICD-10-CM | POA: Diagnosis not present

## 2016-07-28 DIAGNOSIS — E559 Vitamin D deficiency, unspecified: Secondary | ICD-10-CM | POA: Diagnosis not present

## 2016-07-28 DIAGNOSIS — E7800 Pure hypercholesterolemia, unspecified: Secondary | ICD-10-CM

## 2016-07-28 DIAGNOSIS — G8929 Other chronic pain: Secondary | ICD-10-CM

## 2016-07-28 MED ORDER — TRAMADOL HCL 50 MG PO TABS: 50.0000 mg | ORAL_TABLET | Freq: Two times a day (BID) | ORAL | 1 refills | Status: DC | PRN

## 2016-07-28 NOTE — Progress Notes (Signed)
Subjective:    Patient ID: Stephanie Kemp, female    DOB: [birth year], 71 y.o.   MRN: [id number]  HPI  The patient presents for complete physical and review of chronic health problems.   The patient saw Candis Musa, LPN for medicare wellness. Note reviewed in detail and important notes copied below.  Health maintenance: Mammogram - addressed; pt plans to schedule future appt Colon cancer screening - addressed; FOBT kit provided to pt Abnormal screenings:  Fall risk - hx of fall with injury Mini-Cog score: 19/20 Patient concerns:  Lower back pain. Pain scale: 3/10.  TODAY 07/28/16    Chronic back pain from bulging disc.  She is doing home PT daily.  no weakness  X- ray: DDD 06/2015  Last MRI 2014   Hypothyroid  Stable on levo 137 mcg daily Lab Results  Component Value Date   TSH 3.61 06/02/2016    Vit D:  Was ow.. Completed high, dose now on OTC supplement.  Elevated Cholesterol: Was on atorvastatin 20 mg daily Lab Results  Component Value Date   CHOL 251 (H) 05/02/2016   HDL 76.40 05/02/2016   LDLCALC 157 (H) 05/02/2016   LDLDIRECT 145.3 10/11/2012   TRIG 86.0 05/02/2016   CHOLHDL 3 05/02/2016  Using medications without problems:none Muscle aches: none Diet compliance: good Exercise: minimal  Other complaints:    Social History /Family History/Past Medical History reviewed in detail and updated in EMR if needed. Blood pressure 120/70, pulse 81, temperature 98.9 F (37.2 C), temperature source Oral, height _0  (1.626 m), weight 170 lb 4 oz (77.2 kg).   Review of Systems  Constitutional: Negative for fatigue and fever.  HENT: Negative for congestion.   Eyes: Negative for pain.  Respiratory: Negative for cough and shortness of breath.   Cardiovascular: Negative for chest pain, palpitations and leg swelling.  Gastrointestinal: Negative for abdominal pain.  Genitourinary: Negative for dysuria and vaginal bleeding.  Musculoskeletal: Positive  for back pain.  Neurological: Negative for syncope, light-headedness and headaches.  Psychiatric/Behavioral: Negative for dysphoric mood.       Objective:   Physical Exam  Constitutional: Vital signs are normal. She appears well-developed and well-nourished. She is cooperative.  Non-toxic appearance. She does not appear ill. No distress.  HENT:  Head: Normocephalic.  Right Ear: Hearing, tympanic membrane, external ear and ear canal normal.  Left Ear: Hearing, tympanic membrane, external ear and ear canal normal.  Nose: Nose normal.  Eyes: Conjunctivae, EOM and lids are normal. Pupils are equal, round, and reactive to light. Lids are everted and swept, no foreign bodies found.  Neck: Trachea normal and normal range of motion. Neck supple. Carotid bruit is not present. No thyroid mass and no thyromegaly present.  Cardiovascular: Normal rate, regular rhythm, S1 normal, S2 normal, normal heart sounds and intact distal pulses.  Exam reveals no gallop.   No murmur heard. Pulmonary/Chest: Effort normal and breath sounds normal. No respiratory distress. She has no wheezes. She has no rhonchi. She has no rales.  Abdominal: Soft. Normal appearance and bowel sounds are normal. She exhibits no distension, no fluid wave, no abdominal bruit and no mass. There is no hepatosplenomegaly. There is no tenderness. There is no rebound, no guarding and no CVA tenderness. No hernia.  Lymphadenopathy:    She has no cervical adenopathy.    She has no axillary adenopathy.  Neurological: She is alert. She has normal strength. No cranial nerve deficit or sensory deficit.  Skin: Skin is warm,  dry and intact. No rash noted.  Psychiatric: Her speech is normal and behavior is normal. Judgment normal. Her mood appears not anxious. Cognition and memory are normal. She does not exhibit a depressed mood.          Assessment & Plan:  The patient's preventative maintenance and recommended screening tests for an annual  wellness exam were reviewed in full today. Brought up to date unless services declined.  Counselled on the importance of diet, exercise, and its role in overall health and mortality. The patient's FH and SH was reviewed, including their home life, tobacco status, and drug and alcohol status.   PAP/DVE:last pap 2013 nml, no longer indicated. DVE:  low risk, no symptoms and no family history of Uterine and ovarian Hep C done DEXA: 2017 osteopenia, repeat in 5 years mammo: 03/2015, plans to schedule Colon: ifob yearly, will return  vaccines: reviewed

## 2016-07-28 NOTE — Patient Instructions (Addendum)
Restart cholesterol med.  Schedule mammogram on your own.  Return stool test.  Can use tramadol for pain in low back.  Call if not improving, or worsening for further eval with MRI or referral.  Continue home PT and core strengthening.

## 2016-07-28 NOTE — Progress Notes (Signed)
Pre visit review using our clinic review tool, if applicable. No additional management support is needed unless otherwise documented below in the visit note. 

## 2016-08-04 ENCOUNTER — Encounter: Payer: Self-pay | Admitting: *Deleted

## 2016-08-04 ENCOUNTER — Other Ambulatory Visit (INDEPENDENT_AMBULATORY_CARE_PROVIDER_SITE_OTHER): Payer: Medicare Other

## 2016-08-04 DIAGNOSIS — Z1211 Encounter for screening for malignant neoplasm of colon: Secondary | ICD-10-CM

## 2016-08-04 LAB — FECAL OCCULT BLOOD, IMMUNOCHEMICAL: Fecal Occult Bld: NEGATIVE

## 2016-08-07 NOTE — Assessment & Plan Note (Signed)
Stable control. Can use tramadol for pain in low back.  Call if not improving, or worsening for further eval with MRI or referral.  Continue home PT and core strengthening.

## 2016-08-07 NOTE — Assessment & Plan Note (Signed)
Previously better control on statin. Restart atorvastatin daily.

## 2016-08-07 NOTE — Assessment & Plan Note (Signed)
Well controlled. Continue current medication.  

## 2016-10-20 ENCOUNTER — Other Ambulatory Visit: Payer: Self-pay | Admitting: Family Medicine

## 2016-10-20 NOTE — Telephone Encounter (Signed)
Last office visit 07/28/2016.  Last refilled 07/25/2016 for #12 with no refills.  Last Vit D level 05/12/2016 which was low at 21/12 ng/ml.  Refill?

## 2016-10-20 NOTE — Telephone Encounter (Signed)
Left message for Stephanie Kemp that she can change over now to vit D3 OTC 400 IU BID.  She does not need the high dose 50,000 units at this time per Dr. Ermalene SearingBedsole.

## 2016-10-20 NOTE — Telephone Encounter (Signed)
Let pt know she can change over now to vit D3 OTC 400 IU BID.

## 2016-12-12 ENCOUNTER — Other Ambulatory Visit: Payer: Self-pay | Admitting: Family Medicine

## 2016-12-12 NOTE — Telephone Encounter (Signed)
Last office visit 07/28/2016.  Last refilled 07/28/2016 for #60 with 1 refill.  Ok to refill?

## 2016-12-13 NOTE — Telephone Encounter (Signed)
Tramadol called into [id number] - [place], [place] - [address] Phone: [phone] 

## 2016-12-14 ENCOUNTER — Telehealth: Payer: Self-pay | Admitting: *Deleted

## 2016-12-14 NOTE — Telephone Encounter (Signed)
Received fax from [facility] requesting PA for Tramadol 50 mg.  PA completed over the phone and was approved x 1 year.  [facility] notified of approval via fax.

## 2017-01-11 DIAGNOSIS — Z23 Encounter for immunization: Secondary | ICD-10-CM | POA: Diagnosis not present

## 2017-02-18 ENCOUNTER — Emergency Department
Admission: EM | Admit: 2017-02-18 | Discharge: 2017-02-18 | Disposition: A | Discharge status: Home / Self Care | Payer: Medicare Other | Attending: Emergency Medicine | Admitting: Emergency Medicine

## 2017-02-18 DIAGNOSIS — T23012A Burn of unspecified degree of left thumb (nail), initial encounter: Secondary | ICD-10-CM | POA: Insufficient documentation

## 2017-02-18 DIAGNOSIS — T23201A Burn of second degree of right hand, unspecified site, initial encounter: Secondary | ICD-10-CM | POA: Diagnosis not present

## 2017-02-18 DIAGNOSIS — T2007XA Burn of unspecified degree of neck, initial encounter: Secondary | ICD-10-CM | POA: Insufficient documentation

## 2017-02-18 DIAGNOSIS — X101XXA Contact with hot food, initial encounter: Secondary | ICD-10-CM | POA: Insufficient documentation

## 2017-02-18 DIAGNOSIS — E039 Hypothyroidism, unspecified: Secondary | ICD-10-CM | POA: Insufficient documentation

## 2017-02-18 DIAGNOSIS — T2020XA Burn of second degree of head, face, and neck, unspecified site, initial encounter: Secondary | ICD-10-CM | POA: Diagnosis not present

## 2017-02-18 DIAGNOSIS — Y93G3 Activity, cooking and baking: Secondary | ICD-10-CM | POA: Insufficient documentation

## 2017-02-18 DIAGNOSIS — Y92000 Kitchen of unspecified non-institutional (private) residence as  the place of occurrence of the external cause: Secondary | ICD-10-CM | POA: Diagnosis not present

## 2017-02-18 DIAGNOSIS — Z79899 Other long term (current) drug therapy: Secondary | ICD-10-CM | POA: Insufficient documentation

## 2017-02-18 DIAGNOSIS — Y998 Other external cause status: Secondary | ICD-10-CM | POA: Diagnosis not present

## 2017-02-18 DIAGNOSIS — T23091A Burn of unspecified degree of multiple sites of right wrist and hand, initial encounter: Secondary | ICD-10-CM | POA: Insufficient documentation

## 2017-02-18 DIAGNOSIS — Z87891 Personal history of nicotine dependence: Secondary | ICD-10-CM | POA: Diagnosis not present

## 2017-02-18 DIAGNOSIS — T300 Burn of unspecified body region, unspecified degree: Secondary | ICD-10-CM

## 2017-02-18 MED ORDER — LIDOCAINE-EPINEPHRINE-TETRACAINE (LET) TOPICAL GEL
3.0000 mL | Freq: Once | TOPICAL | Status: DC
  Filled 2017-02-18: qty 3

## 2017-02-18 MED ORDER — LIDOCAINE VISCOUS 2 % MT SOLN
15.0000 mL | Freq: Once | OROMUCOSAL | Status: AC
  Administered 2017-02-18: 15 mL via OROMUCOSAL
  Filled 2017-02-18: qty 15

## 2017-02-18 MED ORDER — HYDROCODONE-ACETAMINOPHEN 5-325 MG PO TABS: 1.0000 | ORAL_TABLET | ORAL | 0 refills | Status: DC | PRN

## 2017-02-18 MED ORDER — CEPHALEXIN 500 MG PO CAPS: 500.0000 mg | ORAL_CAPSULE | Freq: Four times a day (QID) | ORAL | 0 refills | Status: DC

## 2017-02-18 MED ORDER — SILVER SULFADIAZINE 1 % EX CREA
TOPICAL_CREAM | Freq: Once | CUTANEOUS | Status: DC
  Filled 2017-02-18: qty 85

## 2017-02-18 MED ORDER — SILVER SULFADIAZINE 1 % EX CREA
TOPICAL_CREAM | CUTANEOUS | Status: AC
  Administered 2017-02-18: 1 via TOPICAL
  Filled 2017-02-18: qty 85

## 2017-02-18 MED ORDER — SILVER SULFADIAZINE 1 % EX CREA: TOPICAL_CREAM | CUTANEOUS | 1 refills | Status: DC

## 2017-02-18 MED ORDER — LIDOCAINE 5 % EX OINT: 1.0000 "application " | TOPICAL_OINTMENT | CUTANEOUS | 1 refills | Status: DC | PRN

## 2017-02-18 MED ORDER — SILVER SULFADIAZINE 1 % EX CREA
TOPICAL_CREAM | Freq: Once | CUTANEOUS | Status: AC
  Administered 2017-02-18: 1 via TOPICAL

## 2017-02-18 NOTE — ED Notes (Signed)
Pt had hot water in blender and when opened splattered onto pt
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
## 2017-02-18 NOTE — ED Notes (Signed)
Waiting on meds from pharmacy 
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
## 2017-04-25 ENCOUNTER — Other Ambulatory Visit: Payer: Self-pay | Admitting: *Deleted

## 2017-04-25 MED ORDER — LEVOTHYROXINE SODIUM 137 MCG PO TABS: 125.0000 ug | ORAL_TABLET | Freq: Every day | ORAL | 0 refills | Status: DC

## 2017-06-09 ENCOUNTER — Other Ambulatory Visit: Payer: Self-pay | Admitting: Family Medicine

## 2017-06-11 NOTE — Telephone Encounter (Signed)
Last office visit 07/28/2016.  Last refilled 12/12/2016 for #60 with 1 refill.  Ok to refill?

## 2017-07-23 ENCOUNTER — Encounter

## 2017-07-23 ENCOUNTER — Ambulatory Visit: Payer: Medicare Other

## 2017-07-24 ENCOUNTER — Ambulatory Visit (INDEPENDENT_AMBULATORY_CARE_PROVIDER_SITE_OTHER): Payer: Medicare Other

## 2017-07-24 ENCOUNTER — Telehealth: Payer: Self-pay | Admitting: Family Medicine

## 2017-07-24 VITALS — BP 122/86 | HR 83 | Temp 98.5°F | Ht 64.0 in | Wt 164.2 lb

## 2017-07-24 DIAGNOSIS — E7800 Pure hypercholesterolemia, unspecified: Secondary | ICD-10-CM

## 2017-07-24 DIAGNOSIS — E559 Vitamin D deficiency, unspecified: Secondary | ICD-10-CM | POA: Diagnosis not present

## 2017-07-24 DIAGNOSIS — E038 Other specified hypothyroidism: Secondary | ICD-10-CM

## 2017-07-24 DIAGNOSIS — Z0000 Encounter for general adult medical examination without abnormal findings: Secondary | ICD-10-CM | POA: Diagnosis not present

## 2017-07-24 LAB — LIPID PANEL
Cholesterol: 158 mg/dL (ref 0–200)
HDL: 64.3 mg/dL (ref >39.00)
LDL CALC: 79 mg/dL (ref 0–99)
NonHDL: 93.9
TRIGLYCERIDES: 74 mg/dL (ref 0.0–149.0)
Total CHOL/HDL Ratio: 2
VLDL: 14.8 mg/dL (ref 0.0–40.0)

## 2017-07-24 LAB — COMPREHENSIVE METABOLIC PANEL
ALBUMIN: 4.1 g/dL (ref 3.5–5.2)
ALK PHOS: 46 U/L (ref 39–117)
ALT: 25 U/L (ref 0–35)
AST: 21 U/L (ref 0–37)
BUN: 15 mg/dL (ref 6–23)
CHLORIDE: 104 meq/L (ref 96–112)
CO2: 29 mEq/L (ref 19–32)
CREATININE: 1.01 mg/dL (ref 0.40–1.20)
Calcium: 9.5 mg/dL (ref 8.4–10.5)
GFR: 57.55 mL/min — ABNORMAL LOW (ref >60.00)
Glucose, Bld: 92 mg/dL (ref 70–99)
POTASSIUM: 4 meq/L (ref 3.5–5.1)
SODIUM: 139 meq/L (ref 135–145)
Total Bilirubin: 0.6 mg/dL (ref 0.2–1.2)
Total Protein: 6.9 g/dL (ref 6.0–8.3)

## 2017-07-24 LAB — TSH: TSH: 0.16 u[IU]/mL — ABNORMAL LOW (ref 0.35–4.50)

## 2017-07-24 LAB — T4, FREE: Free T4: 1.44 ng/dL (ref 0.60–1.60)

## 2017-07-24 LAB — T3, FREE: T3 FREE: 3.6 pg/mL (ref 2.3–4.2)

## 2017-07-24 LAB — VITAMIN D 25 HYDROXY (VIT D DEFICIENCY, FRACTURES): VITD: 45.27 ng/mL (ref 30.00–100.00)

## 2017-07-24 NOTE — Progress Notes (Signed)
Subjective:   Stephanie Kemp is a 72 y.o. female who presents for Medicare Annual (Subsequent) preventive examination.  Review of Systems:  N/A Cardiac Risk Factors include: advanced age (>49men, >89 women);dyslipidemia     Objective:     Vitals: BP 122/86 (BP Location: Right Arm, Patient Position: Sitting, Cuff Size: Normal)   Pulse 83   Temp 98.5 F (36.9 C) (Oral)   Ht  (1.626 m)   Wt 164 lb 4 oz (74.5 kg)   SpO2 95%   BMI 28.19 kg/m   Body mass index is 28.19 kg/m.  Advanced Directives 07/24/2017 02/18/2017 07/20/2016 11/23/2015 07/16/2015 07/16/2015  Does Patient Have a Medical Advance Directive? Yes No;Yes Yes Yes Yes Yes  Type of Estate agent of [place];Living will Living will;Healthcare Power of [employer] Power of [place];Living will Healthcare Power of [place];Living will Healthcare Power of [place];Living will Healthcare Power of [place];Living will  Does patient want to make changes to medical advance directive? - - - - No - Patient declined No - Patient declined  Copy of Healthcare Power of Attorney in Chart? Yes No - copy requested Yes Yes Yes Yes    Tobacco Social History   Tobacco Use  Smoking Status Former Smoker  Smokeless Tobacco Never Used  Tobacco Comment   smoked for several years after high school     Counseling given: No Comment: smoked for several years after high school   Clinical Intake:  Pre-visit preparation completed: Yes  Pain : No/denies pain Pain Score: 0-No pain     Nutritional Status: BMI 25 -29 Overweight Nutritional Risks: None Diabetes: No  How often do you need to have someone help you when you read instructions, pamphlets, or other written materials from your doctor or pharmacy?: 1 - Never What is the last grade level you completed in school?: Associates degree  Interpreter Needed?: No  Comments: pt lives alone Information entered by :: LPinson, LPN  Past Medical History:    Diagnosis Date  . Arthritis    lower back, left hip  . Degenerative disc disease, lumbar   . Headache    migraines.  none in several yrs  . Hypothyroidism   . Pericardial effusion    approx 2010. Unknown etiology. resolved.  . Refusal of blood transfusions as patient is Jehovah's Witness   . S/P dilatation and curettage    Past Surgical History:  Procedure Laterality Date  . BROW LIFT Bilateral 11/23/2015   Procedure: BLEPHAROPLASTY, upper;  Surgeon: Imagene Riches, MD;  Location: [facility];  Service: Ophthalmology;  Laterality: Bilateral;  . DILATION AND CURETTAGE OF UTERUS    . ECTROPION REPAIR Bilateral 11/23/2015   Procedure: REPAIR OF ECTROPION, lower;  Surgeon: Imagene Riches, MD;  Location: [facility];  Service: Ophthalmology;  Laterality: Bilateral;  . PTOSIS REPAIR Bilateral 11/23/2015   Procedure: PTOSIS REPAIR, upper;  Surgeon: Imagene Riches, MD;  Location: [facility];  Service: Ophthalmology;  Laterality: Bilateral;  . REPAIR NONUNION / DEFECT OF RADIUS / ULNA     Repair Right   Family History  Problem Relation Age of Onset  . Heart disease Mother        MI age 61's  . Parkinsonism Mother        ?  Marland Kitchen Cancer Brother        small cell lung cancer  . Mental illness Other        Bipolar  . Cancer Maternal Aunt  breast  . Breast cancer Maternal Aunt   . Cancer Maternal Grandmother        breast cancer  . Breast cancer Maternal Grandmother    Social History   Socioeconomic History  . Marital status: Married    Spouse name: Not on file  . Number of children: 3  . Years of education: Not on file  . Highest education level: Not on file  Occupational History  . 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 Financial resource strain: Not on file  . Food insecurity:    Worry: Not on file    Inability: Not on file  . Transportation needs:    Medical: Not on file    Non-medical: Not on file  Tobacco  Use  . Smoking status: Former Games developer  . Smokeless tobacco: Never Used  . Tobacco comment: smoked for several years after high school  Substance and Sexual Activity  . Alcohol use: Yes    Alcohol/week: 1.2 - 1.8 oz    Types: 2 - 3 Glasses of wine per week  . Drug use: No  . Sexual activity: Never  Lifestyle  . Physical activity:    Days per week: Not on file    Minutes per session: Not on file  . Stress: Not on file  Relationships  . Social connections:    Talks on phone: Not on file    Gets together: Not on file    Attends religious service: Not on file    Active member of club or organization: Not on file    Attends meetings of clubs or organizations: Not on file    Relationship status: Not on file  Other Topics Concern  . Not on file  Social History Narrative   Divorced  And is not currently sexually active. Has 7 grandchildren. She does not get any regular exercise. She does skip breakfast and eats a small amount with frequent snacks. She drinks lots of water and eats a large amount of fruits and vegetables.      Outpatient Encounter Medications as of 07/24/2017  Medication Sig  . atorvastatin (LIPITOR) 20 MG tablet Take 1 tablet (20 mg total) by mouth daily.  . B Complex-Biotin-FA (B-COMPLEX PO) Take 1 tablet by mouth daily.  . Calcium Citrate-Vitamin D (CALCIUM + D PO) Take 1 capsule by mouth 2 (two) times daily.  Marland Kitchen CINNAMON PO Take 2 capsules by mouth daily.  . diphenhydramine-acetaminophen (TYLENOL PM) 25-500 MG TABS tablet Take 1 tablet by mouth at bedtime as needed.  Marland Kitchen levothyroxine (SYNTHROID, LEVOTHROID) 137 MCG tablet Take 1 tablet (137 mcg total) by mouth daily.  . Multiple Vitamins-Minerals (WOMENS MULTIVITAMIN PO) Take 1 tablet by mouth daily.  . Omega-3 Fatty Acids (FISH OIL PO) Take by mouth daily.  . traMADol (ULTRAM) 50 MG tablet TAKE 1 TABLET BY MOUTH EVERY 12 HOURS AS NEEDED  . [DISCONTINUED] cephALEXin (KEFLEX) 500 MG capsule Take 1 capsule (500 mg total)  by mouth 4 (four) times daily.  . [DISCONTINUED] erythromycin [facility]) ophthalmic ointment Use a small amount on your sutures 4 times a day for the next 2 weeks. Switch to Aquaphor ointment should allergy develop.  . [DISCONTINUED] HYDROcodone-acetaminophen (NORCO/VICODIN) 5-325 MG tablet Take 1 tablet by mouth every 4 (four) hours as needed for moderate pain.  . [DISCONTINUED] lidocaine (XYLOCAINE) 5 % ointment Apply 1 application topically as needed.  . [DISCONTINUED] Menthol, Topical Analgesic, (ICY HOT PAIN RELIEVING) 2.5 % GEL Apply  topically as directed.  . [DISCONTINUED] silver sulfADIAZINE (SILVADENE) 1 % cream Apply to affected area daily  . [DISCONTINUED] Vitamin D, Ergocalciferol, (DRISDOL) 50000 units CAPS capsule TAKE 1 CAPSULE (50,000 UNITS TOTAL) BY MOUTH EVERY 7 (SEVEN) DAYS.   No facility-administered encounter medications on file as of 07/24/2017.     Activities of Daily Living In your present state of health, do you have any difficulty performing the following activities: 07/24/2017  Hearing? N  Vision? N  Difficulty concentrating or making decisions? N  Walking or climbing stairs? N  Dressing or bathing? N  Doing errands, shopping? N  Preparing Food and eating ? N  Using the Toilet? N  In the past six months, have you accidently leaked urine? Y  Do you have problems with loss of bowel control? N  Managing your Medications? N  Managing your Finances? N  Housekeeping or managing your Housekeeping? N  Some recent data might be hidden    Patient Care Team: Excell Seltzer, MD as PCP - General    Assessment:   This is a routine wellness examination for [place].   Hearing Screening             Right ear:   40 40 40  40    Left ear:   40 40 40  40      Visual Acuity Screening   Right eye Left eye Both eyes  Without correction:     With correction: 20/25 20/25 20/20-1     Exercise Activities and Dietary  recommendations Current Exercise Habits: Home exercise routine, Type of exercise: walking, Frequency (Times/Week): 7, Intensity: Mild, Exercise limited by: None identified  Goals    . Increase physical activity     When tolerated, I will continue walking at least 6000-7000 steps per day.        Fall Risk Fall Risk  07/24/2017 07/20/2016 05/05/2016 07/16/2015 07/16/2015  Falls in the past year? No Yes Yes No No  Comment - Fall in Summer 2017 as result of shoe soles - - -  Number falls in past yr: - 1 1 - -  Injury with Fall? - Yes No - -   Depression Screen PHQ 2/9 Scores 07/24/2017 07/20/2016 05/05/2016 07/16/2015  PHQ - 2 Score 0 0 0 0  PHQ- 9 Score 0 - - -     Cognitive Function MMSE - Mini Mental State Exam 07/24/2017 07/20/2016 07/16/2015  Orientation to time Orientation to Place Registration Attention/ Calculation 0 0 0  Recall Recall-comments - pt was unable to recall 1 of 3 words -  Language- name 2 objects 0 0 0  Language- repeat Language- follow 3 step command Language- read & follow direction 0 0 0  Write a sentence 0 0 0  Copy design 0 0 0  Total score PLEASE NOTE: A Mini-Cog screen was completed. Maximum score is 20. A value of 0 denotes this part of [place] MMSE was not completed or the patient failed this part of the Mini-Cog screening.   Mini-Cog Screening Orientation to Time - Max 5 pts Orientation to Place - Max 5 pts Registration - Max 3 pts Recall - Max 3 pts Language Repeat - Max 1 pts Language Follow 3 Step Command - Max 3 pts     Immunization History  Administered  Date(s) Administered  . Influenza Whole 01/18/2009  . Influenza, High Dose Seasonal PF 12/01/2015, 01/11/2017  . Influenza,inj,Quad PF,6+ Mos 01/15/2015  . Influenza-Unspecified 12/05/2013  . Pneumococcal Conjugate-13 01/15/2015  . Pneumococcal Polysaccharide-23 10/18/2012  . Tdap 12/04/2007  . Zoster 10/18/2012    Screening  Tests Health Maintenance  Topic Date Due  . MAMMOGRAM  03/19/2018 (Originally 04/06/2016)  . COLONOSCOPY  07/15/2028 (Originally 04/19/1997)  . COLON CANCER SCREENING ANNUAL FOBT  08/04/2017  . INFLUENZA VACCINE  10/18/2017  . TETANUS/TDAP  12/03/2017  . DEXA SCAN  04/06/2020  . Hepatitis C Screening  Completed  . PNA vac Low Risk Adult  Completed       Plan:     I have personally reviewed, addressed, and noted the following in the patient's chart:  A. Medical and social history B. Use of alcohol, tobacco or illicit drugs  C. Current medications and supplements D. Functional ability and status E.  Nutritional status F.  Physical activity G. Advance directives H. List of other physicians I.  Hospitalizations, surgeries, and ER visits in previous 12 months J.  Vitals K. Screenings to include hearing, vision, cognitive, depression L. Referrals and appointments - none  In addition, I have reviewed and discussed with patient certain preventive protocols, quality metrics, and best practice recommendations. A written personalized care plan for preventive services as well as general preventive health recommendations were provided to patient.  See attached scanned questionnaire for additional information.   Signed,   Randa Evens, MHA, BS, LPN Health Coach

## 2017-07-24 NOTE — Telephone Encounter (Signed)
-----   Message from Robert Bellow, LPN sent at 03/25/1094 10:25 AM EDT ----- Regarding: Labs 5/7 Lab orders needed. Thank you.  Insurance:  medicare

## 2017-07-24 NOTE — Patient Instructions (Signed)
Stephanie Kemp , Thank you for taking time to come for your Medicare Wellness Visit. I appreciate your ongoing commitment to your health goals. Please review the following plan we discussed and let me know if I can assist you in the future.   These are the goals we discussed: Goals    . Increase physical activity     When tolerated, I will continue walking at least 6000-7000 steps per day.        This is a list of the screening recommended for you and due dates:  Health Maintenance  Topic Date Due  . Mammogram  03/19/2018*  . Colon Cancer Screening  07/15/2028*  . Stool Blood Test  08/04/2017  . Flu Shot  10/18/2017  . Tetanus Vaccine  12/03/2017  . DEXA scan (bone density measurement)  04/06/2020  .  Hepatitis C: One time screening is recommended by Center for Disease Control  (CDC) for  adults born from 50 through 1965.   Completed  . Pneumonia vaccines  Completed  *Topic was postponed. The date shown is not the original due date.   Preventive Care for Adults  A healthy lifestyle and preventive care can promote health and wellness. Preventive health guidelines for adults include the following key practices.  . A routine yearly physical is a good way to check with your health care provider about your health and preventive screening. It is a chance to share any concerns and updates on your health and to receive a thorough exam.  . Visit your dentist for a routine exam and preventive care every 6 months. Brush your teeth twice a day and floss once a day. Good oral hygiene prevents tooth decay and gum disease.  . The frequency of eye exams is based on your age, health, family medical history, use  of contact lenses, and other factors. Follow your health care provider's recommendations for frequency of eye exams.  . Eat a healthy diet. Foods like vegetables, fruits, whole grains, low-fat dairy products, and lean protein foods contain the nutrients you need without too many calories.  Decrease your intake of foods high in solid fats, added sugars, and salt. Eat the right amount of calories for you. Get information about a proper diet from your health care provider, if necessary.  . Regular physical exercise is one of the most important things you can do for your health. Most adults should get at least 150 minutes of moderate-intensity exercise (any activity that increases your heart rate and causes you to sweat) each week. In addition, most adults need muscle-strengthening exercises on 2 or more days a week.  Silver Sneakers may be a benefit available to you. To determine eligibility, you may visit the website: www.silversneakers.com or contact program at (972)231-8541 Mon-Fri between 8AM-8PM.   . Maintain a healthy weight. The body mass index (BMI) is a screening tool to identify possible weight problems. It provides an estimate of body fat based on height and weight. Your health care provider can find your BMI and can help you achieve or maintain a healthy weight.   For adults 20 years and older: ? A BMI below 18.5 is considered underweight. ? A BMI of 18.5 to 24.9 is normal. ? A BMI of 25 to 29.9 is considered overweight. ? A BMI of 30 and above is considered obese.   . Maintain normal blood lipids and cholesterol levels by exercising and minimizing your intake of saturated fat. Eat a balanced diet with plenty of fruit and  vegetables. Blood tests for lipids and cholesterol should begin at age 11 and be repeated every 5 years. If your lipid or cholesterol levels are high, you are over 50, or you are at high risk for heart disease, you may need your cholesterol levels checked more frequently. Ongoing high lipid and cholesterol levels should be treated with medicines if diet and exercise are not working.  . If you smoke, find out from your health care provider how to quit. If you do not use tobacco, please do not start.  . If you choose to drink alcohol, please do not consume  more than 2 drinks per day. One drink is considered to be 12 ounces (355 mL) of beer, 5 ounces (148 mL) of wine, or 1.5 ounces (44 mL) of liquor.  . If you are 47-41 years old, ask your health care provider if you should take aspirin to prevent strokes.  . Use sunscreen. Apply sunscreen liberally and repeatedly throughout the day. You should seek shade when your shadow is shorter than you. Protect yourself by wearing long sleeves, pants, a wide-brimmed hat, and sunglasses year round, whenever you are outdoors.  . Once a month, do a whole body skin exam, using a mirror to look at the skin on your back. Tell your health care provider of new moles, moles that have irregular borders, moles that are larger than a pencil eraser, or moles that have changed in shape or color.

## 2017-07-24 NOTE — Progress Notes (Signed)
PCP notes:   Health maintenance:  Mammogram - addressed Colon cancer screening - FOBT kit given to patient  Abnormal screenings:   None  Patient concerns:   None  Nurse concerns:  None  Next PCP appt:   08/03/17 @ 1000

## 2017-07-31 ENCOUNTER — Encounter: Payer: Self-pay | Admitting: *Deleted

## 2017-07-31 ENCOUNTER — Other Ambulatory Visit (INDEPENDENT_AMBULATORY_CARE_PROVIDER_SITE_OTHER): Payer: Medicare Other

## 2017-07-31 ENCOUNTER — Encounter: Payer: Medicare Other | Admitting: Family Medicine

## 2017-07-31 ENCOUNTER — Other Ambulatory Visit: Payer: Self-pay | Admitting: Family Medicine

## 2017-07-31 DIAGNOSIS — Z1211 Encounter for screening for malignant neoplasm of colon: Secondary | ICD-10-CM | POA: Diagnosis not present

## 2017-07-31 LAB — FECAL OCCULT BLOOD, IMMUNOCHEMICAL: FECAL OCCULT BLD: NEGATIVE

## 2017-08-03 ENCOUNTER — Encounter: Payer: Self-pay | Admitting: Family Medicine

## 2017-08-03 ENCOUNTER — Other Ambulatory Visit: Payer: Self-pay

## 2017-08-03 ENCOUNTER — Ambulatory Visit (INDEPENDENT_AMBULATORY_CARE_PROVIDER_SITE_OTHER): Payer: Medicare Other | Admitting: Family Medicine

## 2017-08-03 VITALS — BP 120/82 | HR 81 | Temp 99.2°F | Ht 64.0 in | Wt 165.5 lb

## 2017-08-03 DIAGNOSIS — E559 Vitamin D deficiency, unspecified: Secondary | ICD-10-CM | POA: Diagnosis not present

## 2017-08-03 DIAGNOSIS — E038 Other specified hypothyroidism: Secondary | ICD-10-CM

## 2017-08-03 DIAGNOSIS — M8580 Other specified disorders of bone density and structure, unspecified site: Secondary | ICD-10-CM | POA: Diagnosis not present

## 2017-08-03 DIAGNOSIS — E7800 Pure hypercholesterolemia, unspecified: Secondary | ICD-10-CM

## 2017-08-03 DIAGNOSIS — M545 Low back pain: Secondary | ICD-10-CM

## 2017-08-03 DIAGNOSIS — G8929 Other chronic pain: Secondary | ICD-10-CM | POA: Diagnosis not present

## 2017-08-03 MED ORDER — LEVOTHYROXINE SODIUM 137 MCG PO TABS: 125.0000 ug | ORAL_TABLET | Freq: Every day | ORAL | 3 refills | Status: DC

## 2017-08-03 NOTE — Assessment & Plan Note (Signed)
Now at goal  On atorvastatin. Encouraged exercise, weight loss, healthy eating habits.

## 2017-08-03 NOTE — Progress Notes (Signed)
Subjective:    Patient ID: Stephanie Kemp, female    DOB: Jun 27, 1945, 72 y.o.   MRN: [id number]  HPI  The patient presents for complete physical and review of chronic health problems.   The patient saw Candis Musa, LPN for medicare wellness. Note reviewed in detail and important notes copied below. Health maintenance:  Mammogram - addressed Colon cancer screening - FOBT kit given to patient  Abnormal screenings:   None  Patient concerns:   None  08/03/17 Today:  Elevated Cholesterol: excellent control now on atorvastatin 20 mg daily  Using medications without problems: none Muscle aches:  Diet compliance: good Exercise: walking Other complaints:   Hypothyroid  Lab Results  Component Value Date   TSH 0.16 (L) 07/24/2017    vit d def resolved on supplemetn  Social History /Family History/Past Medical History reviewed in detail and updated in EMR if needed. Blood pressure 120/82, pulse 81, temperature 99.2 F (37.3 C), temperature source Oral, height '5\' 4"'  (1.626 m), weight 165 lb 8 oz (75.1 kg). Body mass index is 28.41 kg/m. Wt Readings from Last 3 Encounters:  08/03/17 165 lb 8 oz (75.1 kg)  07/24/17 164 lb 4 oz (74.5 kg)  02/18/17 159 lb (72.1 kg)    Review of Systems  Constitutional: Negative for fatigue and fever.  HENT: Negative for congestion.   Eyes: Negative for pain.  Respiratory: Negative for cough and shortness of breath.   Cardiovascular: Negative for chest pain, palpitations and leg swelling.  Gastrointestinal: Negative for abdominal pain.  Genitourinary: Negative for dysuria and vaginal bleeding.  Musculoskeletal: Negative for back pain.  Neurological: Negative for syncope, light-headedness and headaches.  Psychiatric/Behavioral: Negative for dysphoric mood.       Objective:   Physical Exam  Constitutional: Vital signs are normal. She appears well-developed and well-nourished. She is cooperative.  Non-toxic appearance. She  does not appear ill. No distress.  HENT:  Head: Normocephalic.  Right Ear: Hearing, tympanic membrane, external ear and ear canal normal.  Left Ear: Hearing, tympanic membrane, external ear and ear canal normal.  Nose: Nose normal.  Eyes: Pupils are equal, round, and reactive to light. Conjunctivae, EOM and lids are normal. Lids are everted and swept, no foreign bodies found.  Neck: Trachea normal and normal range of motion. Neck supple. Carotid bruit is not present. No thyroid mass and no thyromegaly present.  Cardiovascular: Normal rate, regular rhythm, S1 normal, S2 normal, normal heart sounds and intact distal pulses. Exam reveals no gallop.  No murmur heard. Pulmonary/Chest: Effort normal and breath sounds normal. No respiratory distress. She has no wheezes. She has no rhonchi. She has no rales.  Abdominal: Soft. Normal appearance and bowel sounds are normal. She exhibits no distension, no fluid wave, no abdominal bruit and no mass. There is no hepatosplenomegaly. There is no tenderness. There is no rebound, no guarding and no CVA tenderness. No hernia.  Lymphadenopathy:    She has no cervical adenopathy.    She has no axillary adenopathy.  Neurological: She is alert. She has normal strength. No cranial nerve deficit or sensory deficit.  Skin: Skin is warm, dry and intact. No rash noted.  Psychiatric: Her speech is normal and behavior is normal. Judgment normal. Her mood appears not anxious. Cognition and memory are normal. She does not exhibit a depressed mood.          Assessment & Plan:  The patient's preventative maintenance and recommended screening tests for an annual wellness exam were  reviewed in full today. Brought up to date unless services declined.  Counselled on the importance of diet, exercise, and its role in overall health and mortality. The patient's FH and SH was reviewed, including their home life, tobacco status, and drug and alcohol status.   PAP/DVE:last pap  2013 nml, no longer indicated. DVE:  low risk, no symptoms and no family history of Uterine and ovarian Hep C done DEXA: 2017 osteopenia, repeat in 5 years mammo: 03/2015  Plan repeat. Colon: ifob yearly, neg  07/2017  vaccines: reviewed

## 2017-08-03 NOTE — Patient Instructions (Addendum)
Work ing on [employer] and regular exercise.  Schedule mammogram on your own.

## 2017-08-03 NOTE — Assessment & Plan Note (Signed)
Using tramadol daily for pain in low back... occ dizziness as SE taking on empty stomach. Change to with food.  No red flags of over use.  Limit use as able.

## 2017-08-03 NOTE — Assessment & Plan Note (Signed)
Stable control on levothyroxine. 

## 2017-08-03 NOTE — Assessment & Plan Note (Signed)
Resolved on supplement 

## 2017-08-16 NOTE — Progress Notes (Signed)
I reviewed health advisor's note, was available for consultation, and agree with documentation and plan.  

## 2017-09-08 ENCOUNTER — Other Ambulatory Visit: Payer: Self-pay | Admitting: Family Medicine

## 2017-09-10 NOTE — Telephone Encounter (Signed)
Last office visit 08/03/2017.  Last refilled 06/12/2017 for #60 with 1 refill.  Ok to refill?

## 2017-09-11 ENCOUNTER — Other Ambulatory Visit: Payer: Self-pay | Admitting: Family Medicine

## 2017-12-11 ENCOUNTER — Other Ambulatory Visit: Payer: Self-pay

## 2017-12-11 MED ORDER — TRAMADOL HCL 50 MG PO TABS: 50.0000 mg | ORAL_TABLET | Freq: Two times a day (BID) | ORAL | 0 refills | Status: DC | PRN

## 2017-12-11 NOTE — Telephone Encounter (Addendum)
I spoke with pt; do not see refill request recently for Tramadol but I will put in request now for tramadol refill; after 3 attempts I was unable to reach [facility] [address] to see if they had requested refills.  Name of Medication: Tramadol 50 mg Name of Pharmacy: [facility] [address] Last Fill or Written Date and Quantity: # 60 x 1 on 09/11/17 Last Office Visit and Type: 08/03/17 annual Next Office Visit and Type: 08/09/18 annual Last Controlled Substance Agreement Date: none  Last UDS: none  Pt will ck with pharmacy today for refill.

## 2017-12-11 NOTE — Telephone Encounter (Signed)
PLEASE NOTE: All timestamps contained within this report are represented as [place] Standard Time. CONFIDENTIALTY NOTICE: This fax transmission is intended only for the addressee. It contains information that is legally privileged, confidential or otherwise protected from use or disclosure. If you are not the intended recipient, you are strictly prohibited from reviewing, disclosing, copying using or disseminating any of this information or taking any action in reliance on or regarding this information. If you have received this fax in error, please notify us immediately by telephone so that we can arrange for its return to us. Phone: [phone], Toll-Free: [phone], Fax: [phone] Page: 1 of 2 Call Id: [id number] [facility] - Client TELEPHONE ADVICE RECORD [facility] Patient Name: Stephanie FootmanLINDA Kemp Gender: Female DOB: 18-May-1945 Age: 72 Y 7 M 24 D Return Phone Number: [phone] (Primary) Address: City/State/Zip: [place] [id number] Client [facility] - Night Physician Kerby NoraBedsole, Amy - MD Contact Type Call Who Is Calling Patient / Member / Family / Caregiver Call Type Triage / Clinical Relationship To Patient Self Return Phone Number [phone] (Primary) Chief Complaint Prescription Refill or Medication Request (non symptomatic) Reason for Call Medication Question / Request Initial Comment Caller states the refills on her medication had expired. The pharm called last week. The medication is still not there. Rx is for Tramadol Additional Comment [facility] [address] [phone] Translation No Nurse Assessment Nurse: Ladona Ridgelaylor, RN, Nedra HaiLee Date/Time (Eastern Time): 12/10/2017 9:01:04 PM Please select the assessment type ---Request for controlled medication refill Additional Documentation ---Needs tramadol refilled Is there an on-call physician for the  client? ---Yes Do the client directives specifically allow for paging the on-call regarding scheduled drugs? ---No Additional Documentation ---calelr will call in am for assist Guidelines Guideline Title Affirmed Question Affirmed Notes Nurse Date/Time Lamount Cohen(Eastern Time) Disp. Time Lamount Cohen(Eastern Time) Disposition Final User 12/10/2017 8:09:25 PM Send To RN Personal Bary RichardBundy, RN, Clerance LavMarianne 12/10/2017 8:09:40 PM Send To RN Personal Bary RichardBundy, RN, Clerance LavMarianne 12/10/2017 8:09:55 PM Send To RN Personal Bary RichardBundy, RN, Clerance LavMarianne 12/10/2017 8:10:07 PM Send To RN Personal Bary RichardBundy, RN, Clerance LavMarianne 12/10/2017 8:10:21 PM Send To RN Personal Bary RichardBundy, RN, Marianne 12/10/2017 8:25:03 PM Attempt made - message left Ladona Ridgelaylor, RNedra Hai, Lee 12/10/2017 9:01:51 PM Clinical Call Yes Ladona Ridgelaylor, RN, Nedra HaiLee PLEASE NOTE: All timestamps contained within this report are represented as [place] Standard Time. CONFIDENTIALTY NOTICE: This fax transmission is intended only for the addressee. It contains information that is legally privileged, confidential or otherwise protected from use or disclosure. If you are not the intended recipient, you are strictly prohibited from reviewing, disclosing, copying using or disseminating any of this information or taking any action in reliance on or regarding this information. If you have received this fax in error, please notify us immediately by telephone so that we can arrange for its return to us. Phone: [phone], Toll-Free: [phone], Fax: [phone] Page: 2 of 2 Call Id: [id number]

## 2017-12-20 ENCOUNTER — Other Ambulatory Visit: Payer: Self-pay | Admitting: Family Medicine

## 2017-12-20 ENCOUNTER — Ambulatory Visit (INDEPENDENT_AMBULATORY_CARE_PROVIDER_SITE_OTHER): Payer: Medicare Other

## 2017-12-20 DIAGNOSIS — Z1231 Encounter for screening mammogram for malignant neoplasm of breast: Secondary | ICD-10-CM

## 2017-12-20 DIAGNOSIS — Z23 Encounter for immunization: Secondary | ICD-10-CM | POA: Diagnosis not present

## 2018-01-03 ENCOUNTER — Ambulatory Visit
Admission: RE | Admit: 2018-01-03 | Discharge: 2018-01-03 | Disposition: A | Discharge status: Home / Self Care | Payer: Medicare Other | Source: Ambulatory Visit | Attending: Family Medicine | Admitting: Family Medicine

## 2018-01-03 DIAGNOSIS — Z1231 Encounter for screening mammogram for malignant neoplasm of breast: Secondary | ICD-10-CM | POA: Insufficient documentation

## 2018-02-24 ENCOUNTER — Other Ambulatory Visit: Payer: Self-pay | Admitting: Family Medicine

## 2018-02-25 NOTE — Telephone Encounter (Signed)
Pt requesting refill tramadol to [facility] [address].

## 2018-02-25 NOTE — Telephone Encounter (Signed)
Last office visit 08/03/2017 for CPE.  Last refilled 12/11/2017 for #60 with no refills.  Next Appt:  08/09/2018 for CPE.

## 2018-03-25 ENCOUNTER — Other Ambulatory Visit: Payer: Self-pay | Admitting: Family Medicine

## 2018-03-25 NOTE — Telephone Encounter (Signed)
Last office visit 08/03/2017 for CPE.  Last refilled 02/26/2018 for #60 with no refills.  Next Appt: 08/09/2018 for CPE.

## 2018-04-17 ENCOUNTER — Other Ambulatory Visit: Payer: Self-pay | Admitting: Family Medicine

## 2018-04-17 NOTE — Telephone Encounter (Signed)
Last office visit 08/03/2017.  Last refilled 03/26/2018 for #60 with no refills.  CPE scheduled 08/09/2018.

## 2018-04-19 ENCOUNTER — Other Ambulatory Visit: Payer: Self-pay | Admitting: Family Medicine

## 2018-04-20 ENCOUNTER — Telehealth: Payer: Self-pay

## 2018-04-20 HISTORY — PX: DENTAL SURGERY: SHX609

## 2018-04-20 IMAGING — DX DG LUMBAR SPINE COMPLETE 4+V
5 series · 5 of 5 positions shown · non-contrast
Comparison: 02/23/2011 MRI

CLINICAL DATA: Chronic low back pain.

EXAM:
LUMBAR SPINE - COMPLETE 4+ VIEW

[l-spine ap]
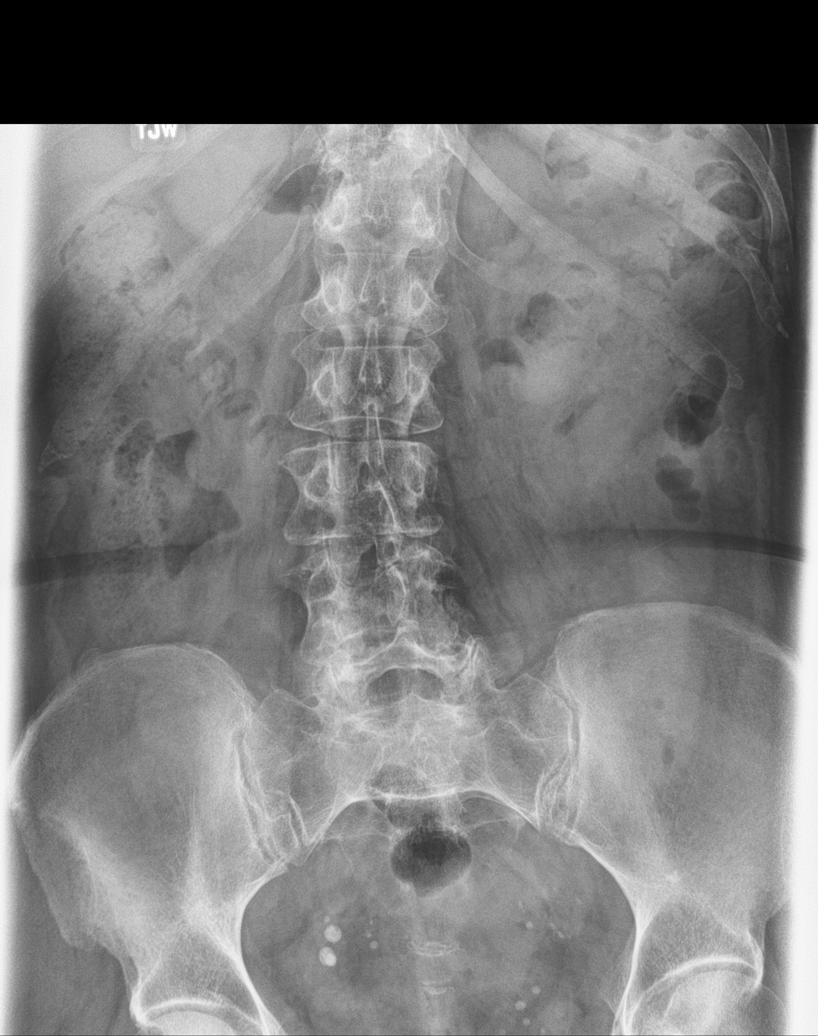

[l-spine obl (1 of 2)]
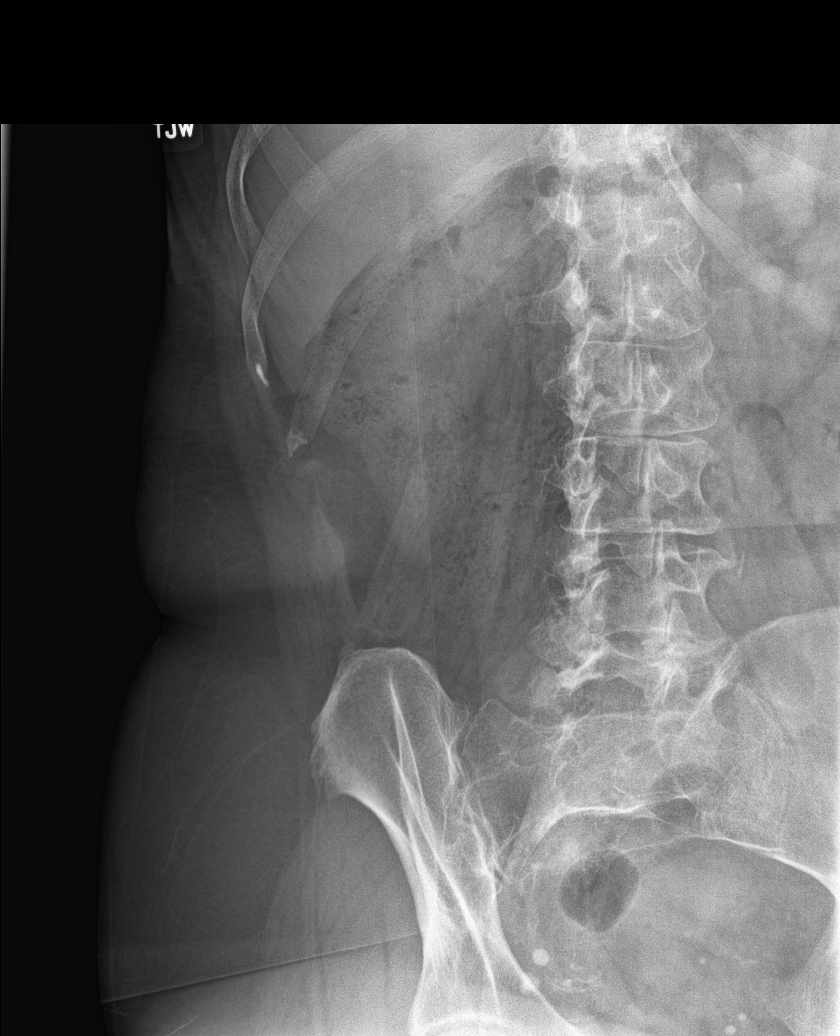

[l-spine obl (2 of 2)]
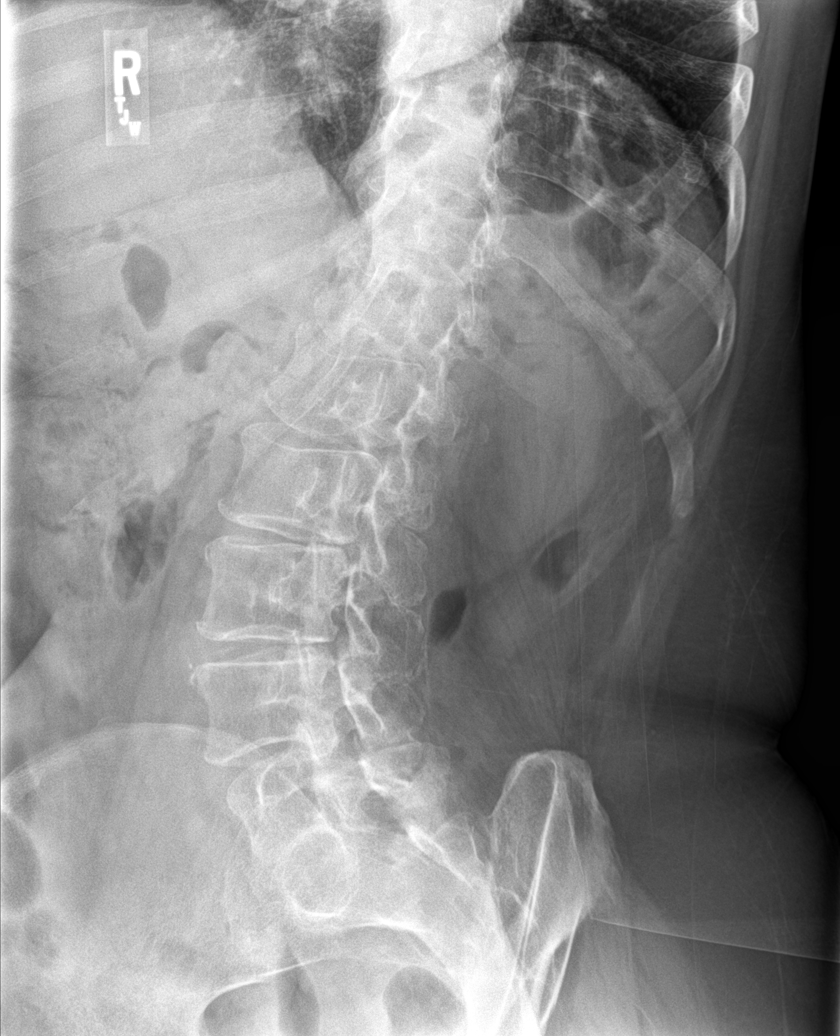

[l-spine lat]
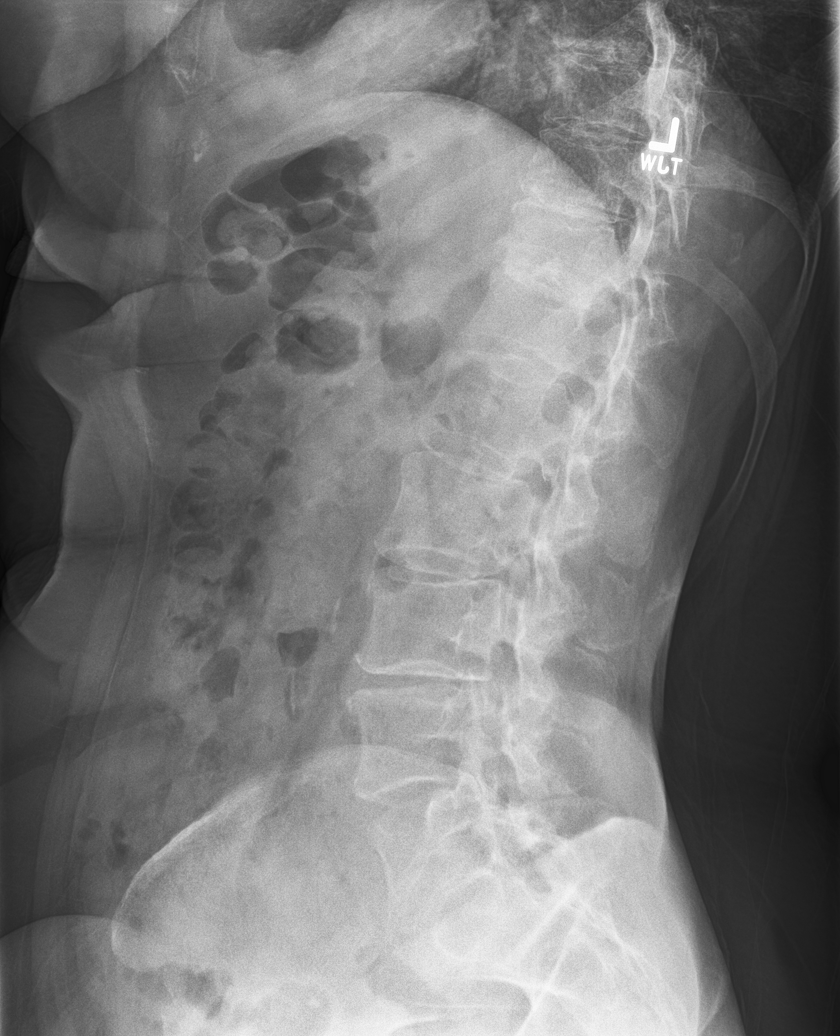

[l-spine l5/s1]
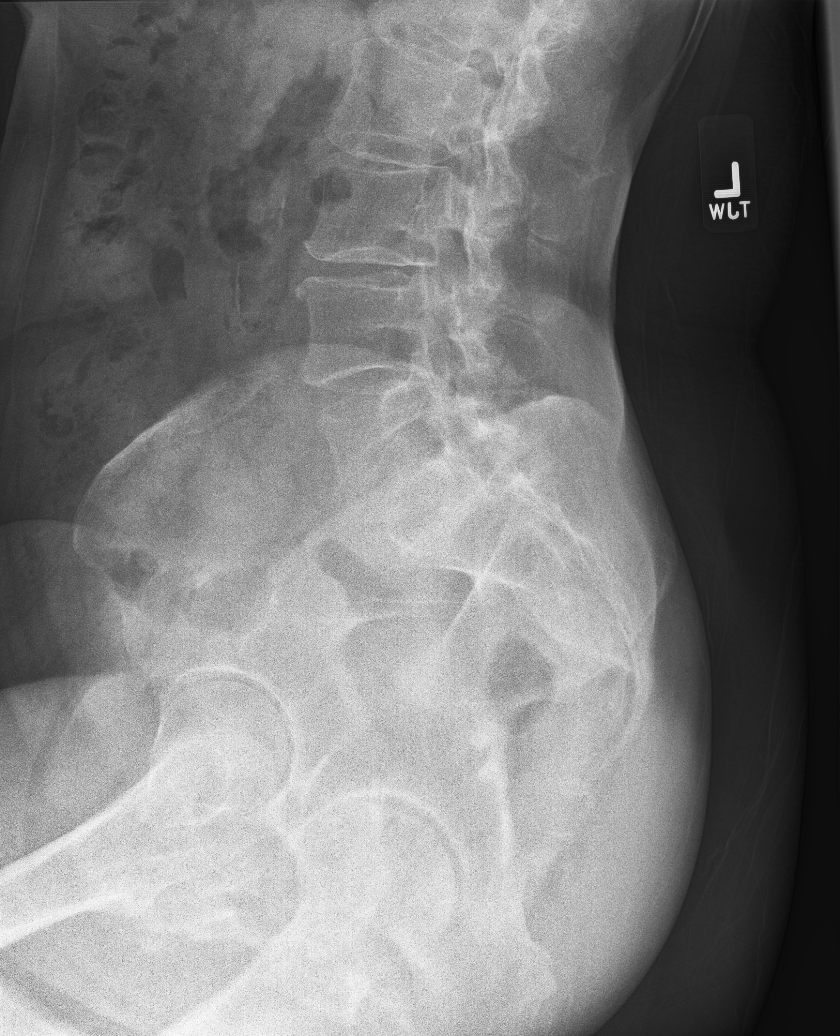

[5 of 5 positions shown; findings below may reference images not displayed]

FINDINGS: Mild generalized rightward scoliosis in the lumbar spine. No
fracture. Diffuse degenerative disc disease and facet disease. SI
joints are symmetric and unremarkable.
IMPRESSION: Mild rightward scoliosis and spondylosis.  No acute findings.

## 2018-04-22 NOTE — Telephone Encounter (Signed)
NOTE: Tramadol was refill on 04/17/18 by Dr. Ermalene Searing.  No r/x refill needed at this time.      Patient Name: Stephanie Kemp Gender: Female DOB: Aug 19, 1945 Age: 73 Y 1 D Return Phone Number: [phone] (Primary) Address: City/State/Zip: [place] [address] Client [facility] Night - Client Client Site [facility] - Night Physician Kerby Nora - MD Contact Type Call Who Is Calling Patient / Member / Family / Caregiver Call Type Triage / Clinical Relationship To Patient Self Return Phone Number [phone] (Primary) Chief Complaint Back Pain - General Reason for Call Symptomatic / Request for Health Information Initial Comment Caller needs a refill on Tramadol. Takes it for back pain, is having back pain. Translation No Nurse Assessment Guidelines Guideline Title Affirmed Question Affirmed Notes Nurse Date/Time (Eastern Time) Disp. Time Lamount Cohen Time) Disposition Final User 04/20/2018 9:10:02 AM Attempt made - message left Standifer, RN, Herbert Seta 04/20/2018 9:19:37 AM FINAL ATTEMPT MADE - no message left Yes Standifer, RN, Herbert Seta

## 2018-07-01 ENCOUNTER — Other Ambulatory Visit: Payer: Self-pay | Admitting: Family Medicine

## 2018-07-01 NOTE — Telephone Encounter (Signed)
Last office visit 08/03/2017 for CPE.  Last refilled 04/18/2018 for #60 with no refills.  CPE scheduled for 08/09/2018.

## 2018-07-24 ENCOUNTER — Other Ambulatory Visit: Payer: Self-pay | Admitting: Family Medicine

## 2018-07-24 NOTE — Telephone Encounter (Signed)
Last office visit 08/03/2017 for CPE.  Last refilled 07/01/2018 for #60 with no refills.  CPE scheduled for 08/09/2018.

## 2018-08-01 ENCOUNTER — Telehealth: Payer: Self-pay | Admitting: Family Medicine

## 2018-08-01 NOTE — Telephone Encounter (Signed)
Left message asking pt to call office  We are slowing bring pt in office for cpx  Is pt ok with this or does she want virtual appointment.  If in office please ask covid 19 questions and put in appointment notes in office or doxy me

## 2018-08-06 ENCOUNTER — Telehealth: Payer: Self-pay | Admitting: Family Medicine

## 2018-08-06 DIAGNOSIS — E559 Vitamin D deficiency, unspecified: Secondary | ICD-10-CM

## 2018-08-06 DIAGNOSIS — E038 Other specified hypothyroidism: Secondary | ICD-10-CM

## 2018-08-06 DIAGNOSIS — E7800 Pure hypercholesterolemia, unspecified: Secondary | ICD-10-CM

## 2018-08-06 NOTE — Telephone Encounter (Signed)
-----   Message from Alvina Chou sent at 08/05/2018 10:50 AM EDT ----- Regarding: Lab orders for Wednesday, 5.20.20 Lab orders, thanks

## 2018-08-07 ENCOUNTER — Other Ambulatory Visit: Payer: Self-pay

## 2018-08-07 ENCOUNTER — Other Ambulatory Visit (INDEPENDENT_AMBULATORY_CARE_PROVIDER_SITE_OTHER): Payer: Medicare Other

## 2018-08-07 ENCOUNTER — Ambulatory Visit (INDEPENDENT_AMBULATORY_CARE_PROVIDER_SITE_OTHER): Payer: Medicare Other

## 2018-08-07 DIAGNOSIS — E559 Vitamin D deficiency, unspecified: Secondary | ICD-10-CM

## 2018-08-07 DIAGNOSIS — E038 Other specified hypothyroidism: Secondary | ICD-10-CM

## 2018-08-07 DIAGNOSIS — E7800 Pure hypercholesterolemia, unspecified: Secondary | ICD-10-CM

## 2018-08-07 DIAGNOSIS — Z0000 Encounter for general adult medical examination without abnormal findings: Secondary | ICD-10-CM | POA: Diagnosis not present

## 2018-08-07 LAB — COMPREHENSIVE METABOLIC PANEL
ALT: 13 U/L (ref 0–35)
AST: 18 U/L (ref 0–37)
Albumin: 4.1 g/dL (ref 3.5–5.2)
Alkaline Phosphatase: 55 U/L (ref 39–117)
BUN: 19 mg/dL (ref 6–23)
CO2: 28 mEq/L (ref 19–32)
Calcium: 8.6 mg/dL (ref 8.4–10.5)
Chloride: 103 mEq/L (ref 96–112)
Creatinine, Ser: 1.08 mg/dL (ref 0.40–1.20)
GFR: 49.97 mL/min — ABNORMAL LOW (ref >60.00)
Glucose, Bld: 90 mg/dL (ref 70–99)
Potassium: 3.5 mEq/L (ref 3.5–5.1)
Sodium: 138 mEq/L (ref 135–145)
Total Bilirubin: 0.5 mg/dL (ref 0.2–1.2)
Total Protein: 6.8 g/dL (ref 6.0–8.3)

## 2018-08-07 LAB — LIPID PANEL
Cholesterol: 196 mg/dL (ref 0–200)
HDL: 69.2 mg/dL (ref >39.00)
LDL Cholesterol: 109 mg/dL — ABNORMAL HIGH (ref 0–99)
NonHDL: 126.93
Total CHOL/HDL Ratio: 3
Triglycerides: 88 mg/dL (ref 0.0–149.0)
VLDL: 17.6 mg/dL (ref 0.0–40.0)

## 2018-08-07 LAB — VITAMIN D 25 HYDROXY (VIT D DEFICIENCY, FRACTURES): VITD: 35.21 ng/mL (ref 30.00–100.00)

## 2018-08-07 LAB — T4, FREE: Free T4: 1.22 ng/dL (ref 0.60–1.60)

## 2018-08-07 LAB — T3, FREE: T3, Free: 2.8 pg/mL (ref 2.3–4.2)

## 2018-08-07 LAB — TSH: TSH: 0.81 u[IU]/mL (ref 0.35–4.50)

## 2018-08-07 NOTE — Progress Notes (Signed)
PCP notes:   Health maintenance:  Colon cancer screening - FOBT kit provided to pt at lab appt Tetanus vaccine - postponed/insurance   Abnormal screenings:   None  Patient concerns:   None  Nurse concerns:  None  Next PCP appt:   08/09/18 @ 0900

## 2018-08-07 NOTE — Patient Instructions (Signed)
Stephanie Kemp , Thank you for taking time to come for your Medicare Wellness Visit. I appreciate your ongoing commitment to your health goals. Please review the following plan we discussed and let me know if I can assist you in the future.   These are the goals we discussed: Goals    . Patient Stated     Starting 08/07/18, I will continue to take medications as prescribed.        This is a list of the screening recommended for you and due dates:  Health Maintenance  Topic Date Due  . Stool Blood Test  03/20/2019*  . Tetanus Vaccine  03/19/2020*  . Colon Cancer Screening  07/15/2028*  . Flu Shot  10/19/2018  . Mammogram  01/04/2019  . DEXA scan (bone density measurement)  04/06/2020  .  Hepatitis C: One time screening is recommended by Center for Disease Control  (CDC) for  adults born from 25 through 1965.   Completed  . Pneumonia vaccines  Completed  *Topic was postponed. The date shown is not the original due date.   Preventive Care for Adults  A healthy lifestyle and preventive care can promote health and wellness. Preventive health guidelines for adults include the following key practices.  . A routine yearly physical is a good way to check with your health care provider about your health and preventive screening. It is a chance to share any concerns and updates on your health and to receive a thorough exam.  . Visit your dentist for a routine exam and preventive care every 6 months. Brush your teeth twice a day and floss once a day. Good oral hygiene prevents tooth decay and gum disease.  . The frequency of eye exams is based on your age, health, family medical history, use  of contact lenses, and other factors. Follow your health care provider's recommendations for frequency of eye exams.  . Eat a healthy diet. Foods like vegetables, fruits, whole grains, low-fat dairy products, and lean protein foods contain the nutrients you need without too many calories. Decrease your  intake of foods high in solid fats, added sugars, and salt. Eat the right amount of calories for you. Get information about a proper diet from your health care provider, if necessary.  . Regular physical exercise is one of the most important things you can do for your health. Most adults should get at least 150 minutes of moderate-intensity exercise (any activity that increases your heart rate and causes you to sweat) each week. In addition, most adults need muscle-strengthening exercises on 2 or more days a week.  Silver Sneakers may be a benefit available to you. To determine eligibility, you may visit the website: www.silversneakers.com or contact program at 203-721-3286 Mon-Fri between 8AM-8PM.   . Maintain a healthy weight. The body mass index (BMI) is a screening tool to identify possible weight problems. It provides an estimate of body fat based on height and weight. Your health care provider can find your BMI and can help you achieve or maintain a healthy weight.   For adults 20 years and older: ? A BMI below 18.5 is considered underweight. ? A BMI of 18.5 to 24.9 is normal. ? A BMI of 25 to 29.9 is considered overweight. ? A BMI of 30 and above is considered obese.   . Maintain normal blood lipids and cholesterol levels by exercising and minimizing your intake of saturated fat. Eat a balanced diet with plenty of fruit and vegetables. Blood tests  for lipids and cholesterol should begin at age 85 and be repeated every 5 years. If your lipid or cholesterol levels are high, you are over 50, or you are at high risk for heart disease, you may need your cholesterol levels checked more frequently. Ongoing high lipid and cholesterol levels should be treated with medicines if diet and exercise are not working.  . If you smoke, find out from your health care provider how to quit. If you do not use tobacco, please do not start.  . If you choose to drink alcohol, please do not consume more than 2  drinks per day. One drink is considered to be 12 ounces (355 mL) of beer, 5 ounces (148 mL) of wine, or 1.5 ounces (44 mL) of liquor.  . If you are 33-59 years old, ask your health care provider if you should take aspirin to prevent strokes.  . Use sunscreen. Apply sunscreen liberally and repeatedly throughout the day. You should seek shade when your shadow is shorter than you. Protect yourself by wearing long sleeves, pants, a wide-brimmed hat, and sunglasses year round, whenever you are outdoors.  . Once a month, do a whole body skin exam, using a mirror to look at the skin on your back. Tell your health care provider of new moles, moles that have irregular borders, moles that are larger than a pencil eraser, or moles that have changed in shape or color.

## 2018-08-07 NOTE — Progress Notes (Signed)
Subjective:   Stephanie Kemp is a 73 y.o. female who presents for Medicare Annual (Subsequent) preventive examination.  Review of Systems:  N/A Cardiac Risk Factors include: advanced age (>98men, >68 women);dyslipidemia     Objective:     Vitals: There were no vitals taken for this visit.  There is no height or weight on file to calculate BMI.  Advanced Directives 08/07/2018 07/24/2017 02/18/2017 07/20/2016 11/23/2015 07/16/2015 07/16/2015  Does Patient Have a Medical Advance Directive? Yes Yes No;Yes Yes Yes Yes Yes  Type of Estate agent of [place];Living will Healthcare Power of [place];Living will Living will;Healthcare Power of [employer] Power of [place];Living will Healthcare Power of [place];Living will Healthcare Power of [place];Living will Healthcare Power of [place];Living will  Does patient want to make changes to medical advance directive? - - - - - No - Patient declined No - Patient declined  Copy of Healthcare Power of Attorney in Chart? Yes - validated most recent copy scanned in chart (See row information) Yes No - copy requested Yes Yes Yes Yes    Tobacco Social History   Tobacco Use  Smoking Status Former Smoker  Smokeless Tobacco Never Used  Tobacco Comment   smoked for several years after high school     Counseling given: No Comment: smoked for several years after high school   Clinical Intake:  Pre-visit preparation completed: Yes  Pain : 0-10 Pain Score: 3  Pain Type: Chronic pain Pain Location: Back     Nutritional Status: BMI 25 -29 Overweight Nutritional Risks: None  How often do you need to have someone help you when you read instructions, pamphlets, or other written materials from your doctor or pharmacy?: 1 - Never What is the last grade level you completed in school?: Associate degree  Interpreter Needed?: No  Comments: pt is divorced and lives independently Information entered by :: LPinson, LPN   Past Medical History:  Diagnosis Date  . Arthritis    lower back, left hip  . Degenerative disc disease, lumbar   . Headache    migraines.  none in several yrs  . Hypothyroidism   . Pericardial effusion    approx 2010. Unknown etiology. resolved.  . Refusal of blood transfusions as patient is Jehovah's Witness   . S/P dilatation and curettage    Past Surgical History:  Procedure Laterality Date  . BROW LIFT Bilateral 11/23/2015   Procedure: BLEPHAROPLASTY, upper;  Surgeon: Imagene Riches, MD;  Location: [facility];  Service: Ophthalmology;  Laterality: Bilateral;  . DENTAL SURGERY  04/2018  . DILATION AND CURETTAGE OF UTERUS    . ECTROPION REPAIR Bilateral 11/23/2015   Procedure: REPAIR OF ECTROPION, lower;  Surgeon: Imagene Riches, MD;  Location: [facility];  Service: Ophthalmology;  Laterality: Bilateral;  . PTOSIS REPAIR Bilateral 11/23/2015   Procedure: PTOSIS REPAIR, upper;  Surgeon: Imagene Riches, MD;  Location: [facility];  Service: Ophthalmology;  Laterality: Bilateral;  . REPAIR NONUNION / DEFECT OF RADIUS / ULNA     Repair Right   Family History  Problem Relation Age of Onset  . Heart disease Mother        MI age 15's  . Parkinsonism Mother        ?  Marland Kitchen Cancer Brother        small cell lung cancer  . Mental illness Other        Bipolar  . Cancer Maternal Aunt  breast  . Breast cancer Maternal Aunt   . Cancer Maternal Grandmother        breast cancer  . Breast cancer Maternal Grandmother    Social History   Socioeconomic History  . Marital status: Divorced    Spouse name: Not on file  . Number of children: 3  . Years of education: Not on file  . Highest education level: Not on file  Occupational History  . 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 Financial resource strain: Not on file  . Food insecurity:    Worry: Not on file    Inability: Not on file  . Transportation needs:    Medical:  Not on file    Non-medical: Not on file  Tobacco Use  . Smoking status: Former Games developermoker  . Smokeless tobacco: Never Used  . Tobacco comment: smoked for several years after high school  Substance and Sexual Activity  . Alcohol use: Yes    Alcohol/week: 2.0 - 3.0 standard drinks    Types: 2 - 3 Glasses of wine per week  . Drug use: No  . Sexual activity: Not Currently  Lifestyle  . Physical activity:    Days per week: Not on file    Minutes per session: Not on file  . Stress: Not on file  Relationships  . Social connections:    Talks on phone: Not on file    Gets together: Not on file    Attends religious service: Not on file    Active member of club or organization: Not on file    Attends meetings of clubs or organizations: Not on file    Relationship status: Not on file  Other Topics Concern  . Not on file  Social History Narrative   Divorced  And is not currently sexually active. Has 7 grandchildren. She does not get any regular exercise. She does skip breakfast and eats a small amount with frequent snacks. She drinks lots of water and eats a large amount of fruits and vegetables.      Outpatient Encounter Medications as of 08/07/2018  Medication Sig  . atorvastatin (LIPITOR) 20 MG tablet TAKE 1 TABLET BY MOUTH ONCE DAILY  . B Complex-Biotin-FA (B-COMPLEX PO) Take 1 tablet by mouth daily.  . Calcium Citrate-Vitamin D (CALCIUM + D PO) Take 1 capsule by mouth 2 (two) times daily.  Marland Kitchen. CINNAMON PO Take 2 capsules by mouth daily.  . diphenhydramine-acetaminophen (TYLENOL PM) 25-500 MG TABS tablet Take 1 tablet by mouth at bedtime as needed.  Janann August. EUTHYROX 137 MCG tablet Take 1 tablet by mouth once daily  . Multiple Vitamins-Minerals (WOMENS MULTIVITAMIN PO) Take 1 tablet by mouth daily.  . Omega-3 Fatty Acids (FISH OIL PO) Take by mouth daily.  . traMADol (ULTRAM) 50 MG tablet TAKE 1 TABLET BY MOUTH EVERY 12 HOURS AS NEEDED   No facility-administered encounter medications on file as  of 08/07/2018.     Activities of Daily Living In your present state of health, do you have any difficulty performing the following activities: 08/07/2018  Hearing? N  Vision? N  Difficulty concentrating or making decisions? N  Walking or climbing stairs? N  Dressing or bathing? N  Doing errands, shopping? N  Preparing Food and eating ? N  Using the Toilet? N  In the past six months, have you accidently leaked urine? Y  Do you have problems with loss of bowel control? N  Managing your Medications? N  Managing your Finances? N  Housekeeping or managing your Housekeeping? N  Some recent data might be hidden    Patient Care Team: Excell Seltzer, MD as PCP - General    Assessment:   This is a routine wellness examination for [place].  Vision Screening Comments: Vision exam in 2019  Exercise Activities and Dietary recommendations Current Exercise Habits: The patient does not participate in regular exercise at present, Exercise limited by: None identified  Goals    . Patient Stated     Starting 08/07/18, I will continue to take medications as prescribed.        Fall Risk Fall Risk  08/07/2018 07/24/2017 07/20/2016 05/05/2016 07/16/2015  Falls in the past year? 0 No Yes Yes No  Comment - - Fall in Summer 2017 as result of shoe soles - -  Number falls in past yr: - - 1 1 -  Injury with Fall? - - Yes No -   Depression Screen PHQ 2/9 Scores 08/07/2018 07/24/2017 07/20/2016 05/05/2016  PHQ - 2 Score 0 0 0 0  PHQ- 9 Score 0 0 - -     Cognitive Function MMSE - Mini Mental State Exam 08/07/2018 07/24/2017 07/20/2016 07/16/2015  Orientation to time Orientation to Place Registration Attention/ Calculation 0 0 0 0  Recall Recall-comments - - pt was unable to recall 1 of 3 words -  Language- name 2 objects 0 0 0 0  Language- repeat Language- follow 3 step command 0 Language- read & follow direction 0 0 0 0  Write a sentence 0 0 0 0  Copy  design 0 0 0 0  Total score PLEASE NOTE: A Mini-Cog screen was completed. Maximum score is 17. A value of 0 denotes this part of [place] MMSE was not completed or the patient failed this part of the Mini-Cog screening.   Mini-Cog Screening Orientation to Time - Max 5 pts Orientation to Place - Max 5 pts Registration - Max 3 pts Recall - Max 3 pts Language Repeat - Max 1 pts      Immunization History  Administered Date(s) Administered  . Influenza Whole 01/18/2009  . Influenza, High Dose Seasonal PF 12/01/2015, 01/11/2017, 12/20/2017  . Influenza,inj,Quad PF,6+ Mos 01/15/2015  . Influenza-Unspecified 12/05/2013  . Pneumococcal Conjugate-13 01/15/2015  . Pneumococcal Polysaccharide-23 10/18/2012  . Tdap 12/04/2007  . Zoster 10/18/2012    Screening Tests Health Maintenance  Topic Date Due  . COLON CANCER SCREENING ANNUAL FOBT  03/20/2019 (Originally 08/01/2018)  . TETANUS/TDAP  03/19/2020 (Originally 12/03/2017)  . COLONOSCOPY  07/15/2028 (Originally 04/19/1997)  . INFLUENZA VACCINE  10/19/2018  . MAMMOGRAM  01/04/2019  . DEXA SCAN  04/06/2020  . Hepatitis C Screening  Completed  . PNA vac Low Risk Adult  Completed      Plan:     I have personally reviewed, addressed, and noted the following in the patient's chart:  A. Medical and social history B. Use of alcohol, tobacco or illicit drugs  C. Current medications and supplements D. Functional ability and status E.  Nutritional status F.  Physical activity G. Advance directives H. List of other physicians I.  Hospitalizations, surgeries, and ER visits in previous 12 months J.  Vitals (unless it is a telemedicine encounter) K. Screenings to include cognitive, depression,  hearing, vision (NOTE: hearing and vision screenings not completed in telemedicine encounter) L. Referrals and appointments   In addition, I have reviewed and discussed with patient certain preventive protocols, quality metrics, and  best practice recommendations. A written personalized care plan for preventive services and recommendations were provided to patient.  Interactive audio and video telecommunications were attempted with patient. This attempt was unsuccessful due to patient having technical difficulties OR patient did not have access to video capability.  Encounter was completed with audio only.  Two patient identifiers were used to ensure the encounter occurred with the correct person.   Patient was in home and writer was in office.   Signed,   Randa Evens, MHA, BS, LPN Health Coach

## 2018-08-08 ENCOUNTER — Telehealth: Payer: Self-pay | Admitting: Family Medicine

## 2018-08-08 NOTE — Telephone Encounter (Signed)
Lab results mailed to patient as requested.

## 2018-08-08 NOTE — Telephone Encounter (Signed)
Patient would like to know if her lab results could be mailed to her.  Verified address on file is correct.   PHONE-[phone]

## 2018-08-09 ENCOUNTER — Encounter: Payer: Self-pay | Admitting: Family Medicine

## 2018-08-09 ENCOUNTER — Ambulatory Visit (INDEPENDENT_AMBULATORY_CARE_PROVIDER_SITE_OTHER): Payer: Medicare Other | Admitting: Family Medicine

## 2018-08-09 VITALS — Temp 98.6°F | Ht 64.5 in | Wt 164.0 lb

## 2018-08-09 DIAGNOSIS — Z1211 Encounter for screening for malignant neoplasm of colon: Secondary | ICD-10-CM | POA: Diagnosis not present

## 2018-08-09 DIAGNOSIS — E7800 Pure hypercholesterolemia, unspecified: Secondary | ICD-10-CM | POA: Diagnosis not present

## 2018-08-09 DIAGNOSIS — M545 Low back pain, unspecified: Secondary | ICD-10-CM

## 2018-08-09 DIAGNOSIS — E559 Vitamin D deficiency, unspecified: Secondary | ICD-10-CM | POA: Diagnosis not present

## 2018-08-09 DIAGNOSIS — E038 Other specified hypothyroidism: Secondary | ICD-10-CM

## 2018-08-09 DIAGNOSIS — G8929 Other chronic pain: Secondary | ICD-10-CM

## 2018-08-09 MED ORDER — ATORVASTATIN CALCIUM 20 MG PO TABS: 20.0000 mg | ORAL_TABLET | Freq: Every day | ORAL | 3 refills | Status: DC

## 2018-08-09 NOTE — Progress Notes (Signed)
I reviewed health advisor's note, was available for consultation, and agree with documentation and plan.  

## 2018-08-09 NOTE — Assessment & Plan Note (Signed)
Stable control on BID tramadol. No red flags for misuse.  Reviewed pdmp

## 2018-08-09 NOTE — Assessment & Plan Note (Signed)
Well controlled. Continue current medication.  

## 2018-08-09 NOTE — Assessment & Plan Note (Signed)
Resolved. ON supplement.

## 2018-08-09 NOTE — Progress Notes (Signed)
VIRTUAL VISIT Due to national recommendations of social distancing due to [place] 19, a virtual visit is felt to be most appropriate for this patient at this time.   I connected with the patient on 08/09/18 at  9:00 AM EDT by virtual telehealth platform and verified that I am speaking with the correct person using two identifiers.   I discussed the limitations, risks, security and privacy concerns of performing an evaluation and management service by  virtual telehealth platform and the availability of in person appointments. I also discussed with the patient that there may be a patient responsible charge related to this service. The patient expressed understanding and agreed to proceed.  Patient location: Home Provider Location: [facility] Participants: Eliezer Lofts and Sharlyn Bologna   Chief Complaint  Patient presents with  . Annual Exam    MCW Part 2    History of Present Illness: The patient presents for review of chronic health problems. He/She also has the following acute concerns today: none  The patient saw Candis Musa, LPN for medicare wellness. Note reviewed in detail and important notes copied below.  Health maintenance: Colon cancer screening - FOBT kit provided to pt at lab appt Tetanus vaccine - postponed/insurance  Abnormal screenings:  None Patient concerns:  None  08/09/18  Chronic low back p[ain: She is having to use tramadol twice daily for pain. She is able to exercise if she is using the medication.  Walking daily 1-1.5 miles.  Hypothyroidism  Lab Results  Component Value Date   TSH 0.81 08/07/2018    Elevated Cholesterol:  She  Has been snacking more in last few weeks.. Lab Results  Component Value Date   CHOL 196 08/07/2018   HDL 69.20 08/07/2018   LDLCALC 109 (H) 08/07/2018   LDLDIRECT 145.3 10/11/2012   TRIG 88.0 08/07/2018   CHOLHDL 3 08/07/2018  Using medications without problems:none Muscle aches: none Diet  compliance: moderate Exercise:daily Other complaints:   Vit D def: resolved   COVID 19 screen No recent travel or known exposure to [place] The patient denies respiratory symptoms of COVID 19 at this time.  The importance of social distancing was discussed today.   Review of Systems  Constitutional: Negative for chills and fever.  HENT: Negative for congestion and ear pain.   Eyes: Negative for pain and redness.  Respiratory: Negative for cough and shortness of breath.   Cardiovascular: Negative for chest pain, palpitations and leg swelling.  Gastrointestinal: Negative for abdominal pain, blood in stool, constipation, diarrhea, nausea and vomiting.  Genitourinary: Negative for dysuria.  Musculoskeletal: Negative for falls and myalgias.  Skin: Negative for rash.  Neurological: Negative for dizziness.  Psychiatric/Behavioral: Negative for depression. The patient is not nervous/anxious.       Past Medical History:  Diagnosis Date  . Arthritis    lower back, left hip  . Degenerative disc disease, lumbar   . Headache    migraines.  none in several yrs  . Hypothyroidism   . Pericardial effusion    approx 2010. Unknown etiology. resolved.  . Refusal of blood transfusions as patient is Jehovah's Witness   . S/P dilatation and curettage     reports that she has quit smoking. She has never used smokeless tobacco. She reports current alcohol use of about 2.0 - 3.0 standard drinks of alcohol per week. She reports that she does not use drugs.   Current Outpatient Medications:  .  atorvastatin (LIPITOR) 20 MG tablet, TAKE 1  TABLET BY MOUTH ONCE DAILY, Disp: 90 tablet, Rfl: 3 .  B Complex-Biotin-FA (B-COMPLEX PO), Take 1 tablet by mouth daily., Disp: , Rfl:  .  Calcium Citrate-Vitamin D (CALCIUM + D PO), Take 1 capsule by mouth 2 (two) times daily., Disp: , Rfl:  .  CINNAMON PO, Take 2 capsules by mouth daily., Disp: , Rfl:  .  diphenhydramine-acetaminophen (TYLENOL PM) 25-500 MG TABS  tablet, Take 1 tablet by mouth at bedtime as needed., Disp: , Rfl:  .  EUTHYROX 137 MCG tablet, Take 1 tablet by mouth once daily, Disp: 90 tablet, Rfl: 0 .  Multiple Vitamins-Minerals (WOMENS MULTIVITAMIN PO), Take 1 tablet by mouth daily., Disp: , Rfl:  .  Omega-3 Fatty Acids (FISH OIL PO), Take by mouth daily., Disp: , Rfl:  .  traMADol (ULTRAM) 50 MG tablet, TAKE 1 TABLET BY MOUTH EVERY 12 HOURS AS NEEDED, Disp: 60 tablet, Rfl: 0   Observations/Objective: Temperature 98.6 F (37 C), height 5' 4.5" (1.638 m), weight 164 lb (74.4 kg).  Physical Exam  Physical Exam Constitutional:      General: The patient is not in acute distress. Pulmonary:     Effort: Pulmonary effort is normal. No respiratory distress.  Neurological:     Mental Status: The patient is alert and oriented to person, place, and time.  Psychiatric:        Mood and Affect: Mood normal.        Behavior: Behavior normal.   Assessment and Plan The patient's preventative maintenance and recommended screening tests for an annual wellness exam were reviewed in full today. Brought up to date unless services declined.  Counselled on the importance of diet, exercise, and its role in overall health and mortality. The patient's FH and SH was reviewed, including their home life, tobacco status, and drug and alcohol status.   PAP/DVE:last pap 2013 nml, no longer indicated. DVE: low risk, no symptoms and no family history of Uterine and ovarian Hep C done DEXA: 2017 osteopenia, repeat in 5 years mammo: 10/17/019  Plan repeat in 1 year Colon: ifob yearly, neg  07/2017...; will pick up stool cards vaccines: reviewed   High cholesterol Slight worsening with change in lifestyle with COVID19 but overall at goal on atorvastatin.  Hypothyroidism Well controlled. Continue current medication.   Vitamin D deficiency Resolved. ON supplement.  Chronic low back pain Stable control on BID tramadol. No red flags for misuse.   Reviewed pdmp   I discussed the assessment and treatment plan with the patient. The patient was provided an opportunity to ask questions and all were answered. The patient agreed with the plan and demonstrated an understanding of the instructions.   The patient was advised to call back or seek an in-person evaluation if the symptoms worsen or if the condition fails to improve as anticipated.     Eliezer Lofts, MD

## 2018-08-09 NOTE — Assessment & Plan Note (Addendum)
Slight worsening with change in lifestyle with COVID19 but overall at goal on atorvastatin.

## 2018-08-14 NOTE — Progress Notes (Signed)
6 month follow up 11/17 Medicare wellness 5/24 cpx medicare 5/27 Pt aware

## 2018-08-20 ENCOUNTER — Other Ambulatory Visit (INDEPENDENT_AMBULATORY_CARE_PROVIDER_SITE_OTHER): Payer: Medicare Other

## 2018-08-20 DIAGNOSIS — Z1211 Encounter for screening for malignant neoplasm of colon: Secondary | ICD-10-CM | POA: Diagnosis not present

## 2018-08-20 LAB — FECAL OCCULT BLOOD, IMMUNOCHEMICAL: Fecal Occult Bld: NEGATIVE

## 2018-09-12 ENCOUNTER — Other Ambulatory Visit: Payer: Self-pay | Admitting: Family Medicine

## 2018-09-12 NOTE — Telephone Encounter (Signed)
Last office visit 08/09/2018 for CPE   Last refilled 07/24/2018 for #60 with no refills.  Next Appt: 02/04/2019 for 6 month follow up.

## 2018-09-15 ENCOUNTER — Emergency Department: Payer: No Typology Code available for payment source

## 2018-09-15 ENCOUNTER — Other Ambulatory Visit: Payer: Self-pay

## 2018-09-15 ENCOUNTER — Encounter: Payer: Self-pay | Admitting: Emergency Medicine

## 2018-09-15 DIAGNOSIS — R1030 Lower abdominal pain, unspecified: Secondary | ICD-10-CM | POA: Insufficient documentation

## 2018-09-15 DIAGNOSIS — Y9389 Activity, other specified: Secondary | ICD-10-CM | POA: Diagnosis not present

## 2018-09-15 DIAGNOSIS — S0990XA Unspecified injury of head, initial encounter: Secondary | ICD-10-CM | POA: Diagnosis not present

## 2018-09-15 DIAGNOSIS — Z87891 Personal history of nicotine dependence: Secondary | ICD-10-CM | POA: Insufficient documentation

## 2018-09-15 DIAGNOSIS — R51 Headache: Secondary | ICD-10-CM | POA: Insufficient documentation

## 2018-09-15 DIAGNOSIS — S2241XA Multiple fractures of ribs, right side, initial encounter for closed fracture: Secondary | ICD-10-CM | POA: Insufficient documentation

## 2018-09-15 DIAGNOSIS — E039 Hypothyroidism, unspecified: Secondary | ICD-10-CM | POA: Diagnosis not present

## 2018-09-15 DIAGNOSIS — M5489 Other dorsalgia: Secondary | ICD-10-CM | POA: Diagnosis not present

## 2018-09-15 DIAGNOSIS — M542 Cervicalgia: Secondary | ICD-10-CM | POA: Diagnosis not present

## 2018-09-15 DIAGNOSIS — S5011XA Contusion of right forearm, initial encounter: Secondary | ICD-10-CM | POA: Diagnosis not present

## 2018-09-15 DIAGNOSIS — S3991XA Unspecified injury of abdomen, initial encounter: Secondary | ICD-10-CM | POA: Diagnosis not present

## 2018-09-15 DIAGNOSIS — R1084 Generalized abdominal pain: Secondary | ICD-10-CM | POA: Diagnosis not present

## 2018-09-15 DIAGNOSIS — R109 Unspecified abdominal pain: Secondary | ICD-10-CM | POA: Diagnosis not present

## 2018-09-15 DIAGNOSIS — Y92410 Unspecified street and highway as the place of occurrence of the external cause: Secondary | ICD-10-CM | POA: Insufficient documentation

## 2018-09-15 DIAGNOSIS — Y999 Unspecified external cause status: Secondary | ICD-10-CM | POA: Insufficient documentation

## 2018-09-15 DIAGNOSIS — S59911A Unspecified injury of right forearm, initial encounter: Secondary | ICD-10-CM | POA: Diagnosis present

## 2018-09-15 DIAGNOSIS — S299XXA Unspecified injury of thorax, initial encounter: Secondary | ICD-10-CM | POA: Diagnosis not present

## 2018-09-15 DIAGNOSIS — N3000 Acute cystitis without hematuria: Secondary | ICD-10-CM | POA: Diagnosis not present

## 2018-09-15 DIAGNOSIS — R079 Chest pain, unspecified: Secondary | ICD-10-CM | POA: Diagnosis not present

## 2018-09-15 DIAGNOSIS — S199XXA Unspecified injury of neck, initial encounter: Secondary | ICD-10-CM | POA: Diagnosis not present

## 2018-09-15 DIAGNOSIS — I10 Essential (primary) hypertension: Secondary | ICD-10-CM | POA: Diagnosis not present

## 2018-09-15 LAB — BASIC METABOLIC PANEL
Anion gap: 11 (ref 5–15)
BUN: 18 mg/dL (ref 8–23)
CO2: 26 mmol/L (ref 22–32)
Calcium: 9.7 mg/dL (ref 8.9–10.3)
Chloride: 101 mmol/L (ref 98–111)
Creatinine, Ser: 0.83 mg/dL (ref 0.44–1.00)
GFR calc Af Amer: >60 mL/min (ref >60)
GFR calc non Af Amer: >60 mL/min (ref >60)
Glucose, Bld: 107 mg/dL — ABNORMAL HIGH (ref 70–99)
Potassium: 3.5 mmol/L (ref 3.5–5.1)
Sodium: 138 mmol/L (ref 135–145)

## 2018-09-15 LAB — CBC WITH DIFFERENTIAL/PLATELET
Abs Immature Granulocytes: 0.11 10*3/uL — ABNORMAL HIGH (ref 0.00–0.07)
Basophils Absolute: 0 10*3/uL (ref 0.0–0.1)
Basophils Relative: 0 %
Eosinophils Absolute: 0.2 10*3/uL (ref 0.0–0.5)
Eosinophils Relative: 2 %
HCT: 42.6 % (ref 36.0–46.0)
Hemoglobin: 14 g/dL (ref 12.0–15.0)
Immature Granulocytes: 1 %
Lymphocytes Relative: 22 %
Lymphs Abs: 2 10*3/uL (ref 0.7–4.0)
MCH: 29.6 pg (ref 26.0–34.0)
MCHC: 32.9 g/dL (ref 30.0–36.0)
MCV: 90.1 fL (ref 80.0–100.0)
Monocytes Absolute: 0.4 10*3/uL (ref 0.1–1.0)
Monocytes Relative: 5 %
Neutro Abs: 6.4 10*3/uL (ref 1.7–7.7)
Neutrophils Relative %: 70 %
Platelets: 273 10*3/uL (ref 150–400)
RBC: 4.73 MIL/uL (ref 3.87–5.11)
RDW: 12.1 % (ref 11.5–15.5)
WBC: 9.2 10*3/uL (ref 4.0–10.5)
nRBC: 0 % (ref 0.0–0.2)

## 2018-09-15 LAB — URINALYSIS, COMPLETE (UACMP) WITH MICROSCOPIC
Bilirubin Urine: NEGATIVE
Glucose, UA: NEGATIVE mg/dL
Hgb urine dipstick: NEGATIVE
Ketones, ur: NEGATIVE mg/dL
Nitrite: NEGATIVE
Protein, ur: NEGATIVE mg/dL
Specific Gravity, Urine: 1.01 (ref 1.005–1.030)
WBC, UA: >50 WBC/hpf — ABNORMAL HIGH (ref 0–5)
pH: 8 (ref 5.0–8.0)

## 2018-09-15 LAB — TROPONIN I (HIGH SENSITIVITY): Troponin I (High Sensitivity): <2 ng/L (ref <18)
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
## 2018-09-16 ENCOUNTER — Emergency Department
Admission: EM | Admit: 2018-09-16 | Discharge: 2018-09-16 | Disposition: A | Discharge status: Home / Self Care | Payer: No Typology Code available for payment source | Attending: Emergency Medicine | Admitting: Emergency Medicine

## 2018-09-16 ENCOUNTER — Encounter: Payer: Self-pay | Admitting: Radiology

## 2018-09-16 ENCOUNTER — Emergency Department: Payer: No Typology Code available for payment source

## 2018-09-16 DIAGNOSIS — S3991XA Unspecified injury of abdomen, initial encounter: Secondary | ICD-10-CM | POA: Diagnosis not present

## 2018-09-16 DIAGNOSIS — R109 Unspecified abdominal pain: Secondary | ICD-10-CM | POA: Diagnosis not present

## 2018-09-16 DIAGNOSIS — N3000 Acute cystitis without hematuria: Secondary | ICD-10-CM

## 2018-09-16 DIAGNOSIS — S5011XA Contusion of right forearm, initial encounter: Secondary | ICD-10-CM | POA: Diagnosis not present

## 2018-09-16 DIAGNOSIS — S2241XA Multiple fractures of ribs, right side, initial encounter for closed fracture: Secondary | ICD-10-CM

## 2018-09-16 DIAGNOSIS — S299XXA Unspecified injury of thorax, initial encounter: Secondary | ICD-10-CM | POA: Diagnosis not present

## 2018-09-16 MED ORDER — OXYCODONE-ACETAMINOPHEN 5-325 MG PO TABS
1.0000 | ORAL_TABLET | Freq: Once | ORAL | Status: AC
  Administered 2018-09-16: 1 via ORAL
  Filled 2018-09-16: qty 1

## 2018-09-16 MED ORDER — IBUPROFEN 400 MG PO TABS
400.0000 mg | ORAL_TABLET | Freq: Once | ORAL | Status: AC | PRN
  Administered 2018-09-16: 400 mg via ORAL
  Filled 2018-09-16: qty 1

## 2018-09-16 MED ORDER — CEPHALEXIN 500 MG PO CAPS: 500.0000 mg | ORAL_CAPSULE | Freq: Three times a day (TID) | ORAL | 0 refills | Status: DC

## 2018-09-16 MED ORDER — SODIUM CHLORIDE 0.9 % IV BOLUS
1000.0000 mL | Freq: Once | INTRAVENOUS | Status: AC
  Administered 2018-09-16: 1000 mL via INTRAVENOUS

## 2018-09-16 MED ORDER — SODIUM CHLORIDE 0.9 % IV SOLN
1.0000 g | Freq: Once | INTRAVENOUS | Status: AC
  Administered 2018-09-16: 1 g via INTRAVENOUS
  Filled 2018-09-16: qty 10

## 2018-09-16 MED ORDER — ONDANSETRON HCL 4 MG/2ML IJ SOLN
4.0000 mg | Freq: Once | INTRAMUSCULAR | Status: AC
  Administered 2018-09-16: 4 mg via INTRAVENOUS
  Filled 2018-09-16: qty 2

## 2018-09-16 MED ORDER — MORPHINE SULFATE (PF) 4 MG/ML IV SOLN
4.0000 mg | Freq: Once | INTRAVENOUS | Status: AC
  Administered 2018-09-16: 4 mg via INTRAVENOUS
  Filled 2018-09-16: qty 1

## 2018-09-16 MED ORDER — OXYCODONE-ACETAMINOPHEN 5-325 MG PO TABS: 1.0000 | ORAL_TABLET | ORAL | 0 refills | Status: DC | PRN

## 2018-09-16 MED ORDER — IOHEXOL 300 MG/ML  SOLN
100.0000 mL | Freq: Once | INTRAMUSCULAR | Status: AC | PRN
  Administered 2018-09-16: 100 mL via INTRAVENOUS

## 2018-09-16 NOTE — ED Notes (Signed)
Dr. Beather Arbour notified of bp of 168/117 and increased pain and bruising to chest and abdomen. New orders received.

## 2018-09-16 NOTE — Discharge Instructions (Addendum)
1.  Take Ibuprofen as needed for pain, Percocet as needed for more severe pain. 2.  Apply ice to affected area several times daily. 3.  Use incentive spirometer as instructed. 4.  Take antibiotic as prescribed (Keflex 500 mg  3 times daily x7 days). 5.  Return to the ER for worsening symptoms, persistent vomiting, fever, difficulty breathing or other concerns.
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
## 2018-09-16 NOTE — ED Notes (Signed)
Patient transported to CT 

## 2018-09-17 ENCOUNTER — Other Ambulatory Visit: Payer: Self-pay

## 2018-09-17 ENCOUNTER — Ambulatory Visit (INDEPENDENT_AMBULATORY_CARE_PROVIDER_SITE_OTHER): Payer: Medicare Other | Admitting: Family Medicine

## 2018-09-17 ENCOUNTER — Encounter: Payer: Self-pay | Admitting: Family Medicine

## 2018-09-17 DIAGNOSIS — E7800 Pure hypercholesterolemia, unspecified: Secondary | ICD-10-CM | POA: Diagnosis not present

## 2018-09-17 DIAGNOSIS — I700 Atherosclerosis of aorta: Secondary | ICD-10-CM | POA: Diagnosis not present

## 2018-09-17 NOTE — Assessment & Plan Note (Signed)
New diagnosis of aortic atherosclerosis.Marland Kitchen need more aggressive cholesterol treatment.  She has been working on [employer] and exercise. LDL new goal < 70.
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
## 2018-09-17 NOTE — Patient Instructions (Addendum)
Once oxycodone gone for pain control.. can try tramadol or ibuprofen 800 mg three times daily for pain.. but call if pain not controlled.  Work on heart healthy diet and exercise. Marland Kitchen

## 2018-09-17 NOTE — Assessment & Plan Note (Signed)
Need LDL tighter control. On statin. LDL goal < 70.

## 2018-10-17 ENCOUNTER — Other Ambulatory Visit (INDEPENDENT_AMBULATORY_CARE_PROVIDER_SITE_OTHER): Payer: Medicare Other

## 2018-10-17 ENCOUNTER — Telehealth: Payer: Self-pay | Admitting: Family Medicine

## 2018-10-17 ENCOUNTER — Other Ambulatory Visit: Payer: Self-pay

## 2018-10-17 DIAGNOSIS — E7800 Pure hypercholesterolemia, unspecified: Secondary | ICD-10-CM | POA: Diagnosis not present

## 2018-10-17 LAB — COMPREHENSIVE METABOLIC PANEL
ALT: 17 U/L (ref 0–35)
AST: 17 U/L (ref 0–37)
Albumin: 4.6 g/dL (ref 3.5–5.2)
Alkaline Phosphatase: 75 U/L (ref 39–117)
BUN: 15 mg/dL (ref 6–23)
CO2: 26 mEq/L (ref 19–32)
Calcium: 9.4 mg/dL (ref 8.4–10.5)
Chloride: 104 mEq/L (ref 96–112)
Creatinine, Ser: 1.08 mg/dL (ref 0.40–1.20)
GFR: 49.94 mL/min — ABNORMAL LOW (ref >60.00)
Glucose, Bld: 97 mg/dL (ref 70–99)
Potassium: 4 mEq/L (ref 3.5–5.1)
Sodium: 138 mEq/L (ref 135–145)
Total Bilirubin: 0.5 mg/dL (ref 0.2–1.2)
Total Protein: 7 g/dL (ref 6.0–8.3)

## 2018-10-17 LAB — LIPID PANEL
Cholesterol: 166 mg/dL (ref 0–200)
HDL: 63.7 mg/dL (ref >39.00)
LDL Cholesterol: 78 mg/dL (ref 0–99)
NonHDL: 102.69
Total CHOL/HDL Ratio: 3
Triglycerides: 121 mg/dL (ref 0.0–149.0)
VLDL: 24.2 mg/dL (ref 0.0–40.0)

## 2018-10-17 NOTE — Telephone Encounter (Signed)
-----   Message from Ellamae Sia sent at 10/08/2018 11:16 AM EDT ----- Regarding: lab orders for Thursday, 7.30.20 Lab orders for 1 month f/u

## 2018-10-18 ENCOUNTER — Other Ambulatory Visit: Payer: Self-pay | Admitting: Family Medicine

## 2018-10-18 NOTE — Telephone Encounter (Signed)
Last office visit 06.30.2020 for hospital follow up.  Last refilled 09/16/2018 for #60 with no refills.  Next Appt: 02/04/2019 for 6 month follow up.

## 2018-11-03 NOTE — Assessment & Plan Note (Signed)
No need for additional X-rays.  Once oxycodone gone for pain control.. can try tramadol or ibuprofen 800 mg three times daily for pain.. but call if pain not controlled.

## 2018-11-19 ENCOUNTER — Other Ambulatory Visit: Payer: Self-pay | Admitting: Family Medicine

## 2018-11-20 ENCOUNTER — Ambulatory Visit (INDEPENDENT_AMBULATORY_CARE_PROVIDER_SITE_OTHER): Payer: Medicare Other

## 2018-11-20 DIAGNOSIS — Z23 Encounter for immunization: Secondary | ICD-10-CM | POA: Diagnosis not present

## 2018-11-20 NOTE — Telephone Encounter (Signed)
Last office visit 09/17/2018 for hospital follow up.  Last refilled 10/18/2018 for #60 with no refills. Next Appt:02/04/2019 for 6 month follow up.

## 2018-11-21 ENCOUNTER — Other Ambulatory Visit: Payer: Self-pay | Admitting: Family Medicine

## 2018-11-21 DIAGNOSIS — Z1231 Encounter for screening mammogram for malignant neoplasm of breast: Secondary | ICD-10-CM

## 2018-12-16 ENCOUNTER — Other Ambulatory Visit: Payer: Self-pay | Admitting: Family Medicine

## 2018-12-16 NOTE — Telephone Encounter (Signed)
Last office visit 09/17/2018 for hospital follow up.  Last refilled 11/20/2018 for #60 with no refills. Next Appt: 02/04/2019 for 6 month follow up back pain.

## 2019-01-06 ENCOUNTER — Ambulatory Visit
Admission: RE | Admit: 2019-01-06 | Discharge: 2019-01-06 | Disposition: A | Discharge status: Home / Self Care | Payer: Medicare Other | Source: Ambulatory Visit | Attending: Family Medicine | Admitting: Family Medicine

## 2019-01-06 DIAGNOSIS — Z1231 Encounter for screening mammogram for malignant neoplasm of breast: Secondary | ICD-10-CM | POA: Diagnosis not present

## 2019-01-07 NOTE — Progress Notes (Signed)
No critical labs need to be addressed urgently. We will discuss labs in detail at upcoming office visit.   

## 2019-01-24 ENCOUNTER — Other Ambulatory Visit: Payer: Self-pay | Admitting: Family Medicine

## 2019-01-24 NOTE — Telephone Encounter (Signed)
Last office visit 09/17/2018 for hospital follow up.  Last refilled 12/17/2018 for #60 with no refills. CPE scheduled for 08/14/2019.

## 2019-02-04 ENCOUNTER — Ambulatory Visit: Payer: Medicare Other | Admitting: Family Medicine

## 2019-03-15 ENCOUNTER — Other Ambulatory Visit: Payer: Self-pay | Admitting: Family Medicine

## 2019-03-16 ENCOUNTER — Other Ambulatory Visit: Payer: Self-pay | Admitting: Family Medicine

## 2019-03-17 NOTE — Telephone Encounter (Signed)
Last filled on 01/24/2019 #60 with 0 refill. LOV 09/17/2018  Next appointment on 08/14/2019 for CPE

## 2019-03-17 NOTE — Telephone Encounter (Signed)
Refill request Tramadol  Last refill 01/24/20 #60 Last office visit 09/17/18 Upcoming appointment 08/14/19

## 2019-04-17 ENCOUNTER — Other Ambulatory Visit: Payer: Self-pay | Admitting: Family Medicine

## 2019-04-17 NOTE — Telephone Encounter (Signed)
Last office visit 09/17/2018 for hospital follow up.  Last refilled 03/17/2019 for #60 with no refills.  CPE scheduled for 07/25/2019.

## 2019-08-02 ENCOUNTER — Other Ambulatory Visit: Payer: Self-pay | Admitting: Family Medicine

## 2019-08-04 NOTE — Telephone Encounter (Signed)
Last office visit 09/17/2018 for hospital follow up.  Last refilled 04/17/2019 for #60 with no refills.  CPE scheduled for 08/14/2019.

## 2019-08-05 ENCOUNTER — Telehealth: Payer: Self-pay | Admitting: Family Medicine

## 2019-08-05 NOTE — Telephone Encounter (Signed)
LVM to call back to change appt to 8:15 with [facility] on 08/11/19.

## 2019-08-11 ENCOUNTER — Ambulatory Visit: Payer: Medicare Other

## 2019-08-11 ENCOUNTER — Telehealth: Payer: Self-pay

## 2019-08-11 ENCOUNTER — Other Ambulatory Visit (INDEPENDENT_AMBULATORY_CARE_PROVIDER_SITE_OTHER): Payer: Medicare Other

## 2019-08-11 ENCOUNTER — Telehealth: Payer: Self-pay | Admitting: Family Medicine

## 2019-08-11 DIAGNOSIS — E038 Other specified hypothyroidism: Secondary | ICD-10-CM

## 2019-08-11 DIAGNOSIS — E559 Vitamin D deficiency, unspecified: Secondary | ICD-10-CM

## 2019-08-11 DIAGNOSIS — E7800 Pure hypercholesterolemia, unspecified: Secondary | ICD-10-CM

## 2019-08-11 LAB — COMPREHENSIVE METABOLIC PANEL
ALT: 17 U/L (ref 0–35)
AST: 16 U/L (ref 0–37)
Albumin: 4.2 g/dL (ref 3.5–5.2)
Alkaline Phosphatase: 60 U/L (ref 39–117)
BUN: 16 mg/dL (ref 6–23)
CO2: 27 mEq/L (ref 19–32)
Calcium: 9.2 mg/dL (ref 8.4–10.5)
Chloride: 107 mEq/L (ref 96–112)
Creatinine, Ser: 1.01 mg/dL (ref 0.40–1.20)
GFR: 53.83 mL/min — ABNORMAL LOW (ref >60.00)
Glucose, Bld: 97 mg/dL (ref 70–99)
Potassium: 3.7 mEq/L (ref 3.5–5.1)
Sodium: 142 mEq/L (ref 135–145)
Total Bilirubin: 0.6 mg/dL (ref 0.2–1.2)
Total Protein: 6.4 g/dL (ref 6.0–8.3)

## 2019-08-11 LAB — T3, FREE: T3, Free: 3.4 pg/mL (ref 2.3–4.2)

## 2019-08-11 LAB — LIPID PANEL
Cholesterol: 175 mg/dL (ref 0–200)
HDL: 59.6 mg/dL (ref >39.00)
LDL Cholesterol: 91 mg/dL (ref 0–99)
NonHDL: 115.22
Total CHOL/HDL Ratio: 3
Triglycerides: 119 mg/dL (ref 0.0–149.0)
VLDL: 23.8 mg/dL (ref 0.0–40.0)

## 2019-08-11 LAB — TSH: TSH: 0.06 u[IU]/mL — ABNORMAL LOW (ref 0.35–4.50)

## 2019-08-11 LAB — T4, FREE: Free T4: 1.74 ng/dL — ABNORMAL HIGH (ref 0.60–1.60)

## 2019-08-11 LAB — VITAMIN D 25 HYDROXY (VIT D DEFICIENCY, FRACTURES): VITD: 33.15 ng/mL (ref 30.00–100.00)

## 2019-08-11 NOTE — Telephone Encounter (Signed)
Called patient 3 times trying to complete her Medicare Wellness visit. Patient never answered. Left message on voicemail notifying patient that appointment was cancelled and to call and reschedule if she would like.

## 2019-08-11 NOTE — Telephone Encounter (Signed)
-----   Message from Aquilla Solian, RT sent at 08/05/2019  1:29 PM EDT ----- Regarding: Lab Orders for Monday 5.24.2021 Please place lab orders for Monday 5.24.2021, office visit for physical on Thursday 5.27.2021 Thank you, Jones Bales RT(R)

## 2019-08-14 ENCOUNTER — Other Ambulatory Visit: Payer: Self-pay

## 2019-08-14 ENCOUNTER — Encounter: Payer: Medicare Other | Admitting: Family Medicine

## 2019-08-14 ENCOUNTER — Encounter: Payer: Self-pay | Admitting: Family Medicine

## 2019-08-14 ENCOUNTER — Ambulatory Visit (INDEPENDENT_AMBULATORY_CARE_PROVIDER_SITE_OTHER): Payer: Medicare Other | Admitting: Family Medicine

## 2019-08-14 VITALS — BP 146/89 | HR 80 | Temp 98.1°F | Ht 64.0 in | Wt 163.2 lb

## 2019-08-14 DIAGNOSIS — G8929 Other chronic pain: Secondary | ICD-10-CM | POA: Diagnosis not present

## 2019-08-14 DIAGNOSIS — M8580 Other specified disorders of bone density and structure, unspecified site: Secondary | ICD-10-CM | POA: Diagnosis not present

## 2019-08-14 DIAGNOSIS — Z0000 Encounter for general adult medical examination without abnormal findings: Secondary | ICD-10-CM

## 2019-08-14 DIAGNOSIS — M545 Low back pain, unspecified: Secondary | ICD-10-CM

## 2019-08-14 DIAGNOSIS — E038 Other specified hypothyroidism: Secondary | ICD-10-CM | POA: Diagnosis not present

## 2019-08-14 DIAGNOSIS — E559 Vitamin D deficiency, unspecified: Secondary | ICD-10-CM

## 2019-08-14 DIAGNOSIS — I700 Atherosclerosis of aorta: Secondary | ICD-10-CM

## 2019-08-14 DIAGNOSIS — E7800 Pure hypercholesterolemia, unspecified: Secondary | ICD-10-CM

## 2019-08-14 NOTE — Progress Notes (Signed)
Chief Complaint  Patient presents with  . Medicare Wellness   History of Present Illness:  HPI  The patient presents for annual medicare wellness, complete physical and review of chronic health problems. He/She also has the following acute concerns today: fatigue  She is deconditioned, not doing as much with the pandemic.  She is sleeping well at night.  I have personally reviewed the Medicare Annual Wellness questionnaire and have noted  1. The patient's medical and social history  2. Their use of alcohol, tobacco or illicit drugs  3. Their current medications and supplements  4. The patient's functional ability including ADL's, fall risks, home safety risks and hearing or visual  impairment.  5. Diet and physical activities  6. Evidence for depression or mood disorders  7. Updated provider list  Cognitive evaluation was performed and recorded on pt medicare questionnaire form.  The patients weight, height, BMI and visual acuity have been recorded in the chart  I have made referrals, counseling and provided education to the patient based review of the above and I have provided the pt with a written personalized care plan for preventive services.  Documentation of this information was scanned into the electronic record under the media tab.  Advance directives and end of life planning reviewed in detail with patient and documented in EMR. Patient given handout on advance care directives if needed. HCPOA and living will updated if needed.  Fall Risk  08/14/2019 08/07/2018 07/24/2017 07/20/2016 05/05/2016  Falls in the past year? 0 0 No Yes Yes  Comment - - - Fall in Summer 2017 as result of shoe soles -  Number falls in past yr: - - - 1 1  Injury with Fall? - - - Yes No   PHQ9 SCORE ONLY 08/14/2019 08/07/2018 07/24/2017  PHQ-9 Total Score 0 0 0              Hearing Screening  Method: Audiometry   125Hz  250Hz  500Hz  1000Hz  2000Hz  3000Hz  4000Hz  6000Hz  8000Hz   Right ear:   20 20 20  20     Left  ear:   20 20 20  20     Vision Screening Comments: Wears Glasses-Eye Exam with Assoc on 08/12/2019.   Aortic atherosclerosis: on statin, risk factor reduction.  Elevated Cholesterol: Not yet at goal LDL < 70 on atorvastatin 20 mg daily  Recent Labs                                                    Using medications without problems:  Muscle aches:  Diet compliance: eating less  Exercise: minimal.  Other complaints:;  Hypothyroid Possibly overtreated T4 > 1.6 on levothyroxine 137 mcg daily.  Recent Labs                      Borderline elevated BP.  not checking at home.  Rushed here today     BP Readings from Last 3 Encounters:  08/14/19 140/90  09/17/18 (!) 140/98  09/16/18 135/87   Vi D def  Chronic low back pain ON tramadol Using BID prn.. # 60 has lasted 4 months.  No red flags, pdmp reviewed.  She does have some GERD... better on less  This visit occurred during the SARS-CoV-2 public health emergency. Safety protocols were in place, including screening questions prior  to the visit, additional usage of staff PPE, and extensive cleaning of exam room while observing appropriate contact time as indicated for disinfecting solutions.  COVID 19 screen: No recent travel or known exposure to COVID19  The patient denies respiratory symptoms of COVID 19 at this time.  The importance of social distancing was discussed today.  Review of Systems  Constitutional: Negative for chills and fever.  HENT: Negative for congestion and ear pain.  Eyes: Negative for pain and redness.  Respiratory: Negative for cough and shortness of breath.  Cardiovascular: Negative for chest pain, palpitations and leg swelling.  Gastrointestinal: Negative for abdominal pain, blood in stool, constipation, diarrhea, nausea and vomiting.  Genitourinary: Negative for dysuria.  Musculoskeletal: Negative for falls and myalgias.  Skin: Negative for rash.  Neurological: Negative for dizziness.   Psychiatric/Behavioral: Negative for depression. The patient is not nervous/anxious.      Past Medical History:  Diagnosis Date  . Arthritis    lower back, left hip  . Degenerative disc disease, lumbar   . Headache    migraines. none in several yrs  . Hypothyroidism   . Pericardial effusion    approx 2010. Unknown etiology. resolved.  . Refusal of blood transfusions as patient is Jehovah's Witness   . S/P dilatation and curettage    reports that she has quit smoking. She has never used smokeless tobacco. She reports current alcohol use of about 2.0 - 3.0 standard drinks of alcohol per week. She reports that she does not use drugs.   Current Outpatient Medications:  . atorvastatin (LIPITOR) 20 MG tablet, Take 1 tablet (20 mg total) by mouth daily., Disp: 90 tablet, Rfl: 3  . Calcium Citrate-Vitamin D (CALCIUM + D PO), Take 1 capsule by mouth 2 (two) times daily., Disp: , Rfl:  . cholecalciferol (VITAMIN D3) 25 MCG (1000 UT) tablet, Take 1,000 Units by mouth daily., Disp: , Rfl:  . diphenhydramine-acetaminophen (TYLENOL PM) 25-500 MG TABS tablet, Take 1 tablet by mouth at bedtime as needed., Disp: , Rfl:  . EUTHYROX 137 MCG tablet, Take 1 tablet by mouth once daily, Disp: 90 tablet, Rfl: 3  . Omega-3 Fatty Acids (FISH OIL PO), Take by mouth daily., Disp: , Rfl:  . traMADol (ULTRAM) 50 MG tablet, TAKE 1 TABLET BY MOUTH EVERY 12 HOURS AS NEEDED, Disp: 60 tablet, Rfl: 0  Observations/Objective:  Pulse 93, temperature 98.1 F (36.7 C), temperature source Temporal, height 5\' 4"  (1.626 m), weight 163 lb 4 oz (74 kg), SpO2 98 %.  Physical Exam  Physical Exam Constitutional:      General: She is not in acute distress.    Appearance: Normal appearance. She is well-developed. She is not ill-appearing or toxic-appearing.  HENT:     Head: Normocephalic.     Right Ear: Hearing, tympanic membrane, ear canal and external ear normal. Tympanic membrane is not erythematous, retracted or bulging.      Left Ear: Hearing, tympanic membrane, ear canal and external ear normal. Tympanic membrane is not erythematous, retracted or bulging.     Nose: No mucosal edema or rhinorrhea.     Right Sinus: No maxillary sinus tenderness or frontal sinus tenderness.     Left Sinus: No maxillary sinus tenderness or frontal sinus tenderness.     Mouth/Throat:     Pharynx: Uvula midline.  Eyes:     General: Lids are normal. Lids are everted, no foreign bodies appreciated.     Conjunctiva/sclera: Conjunctivae normal.     Pupils: Pupils  are equal, round, and reactive to light.  Neck:     Thyroid: No thyroid mass or thyromegaly.     Vascular: No carotid bruit.     Trachea: Trachea normal.  Cardiovascular:     Rate and Rhythm: Normal rate and regular rhythm.     Pulses: Normal pulses.     Heart sounds: Normal heart sounds, S1 normal and S2 normal. No murmur. No friction rub. No gallop.   Pulmonary:     Effort: Pulmonary effort is normal. No tachypnea or respiratory distress.     Breath sounds: Normal breath sounds. No decreased breath sounds, wheezing, rhonchi or rales.  Abdominal:     General: Bowel sounds are normal.     Palpations: Abdomen is soft.     Tenderness: There is no abdominal tenderness.  Musculoskeletal:     Cervical back: Normal range of motion and neck supple.  Skin:    General: Skin is warm and dry.     Findings: No rash.  Neurological:     Mental Status: She is alert.  Psychiatric:        Mood and Affect: Mood is not anxious or depressed.        Speech: Speech normal.        Behavior: Behavior normal. Behavior is cooperative.        Thought Content: Thought content normal.        Judgment: Judgment normal.    Assessment and Plan  The patient's preventative maintenance and recommended screening tests for an annual wellness exam were reviewed in full today.  Brought up to date unless services declined.  Counselled on the importance of diet, exercise, and its role in overall  health and mortality.  The patient's FH and SH was reviewed, including their home life, tobacco status, and drug and alcohol status.  PAP/DVE:last pap 2013 nml, no longer indicated.  DVE: low risk, no symptoms and no family history of Uterine and ovarian  Hep C done  DEXA: 2017 osteopenia, repeat in 5 years.. due in 2022  mammo: 12/2018 Plan repeat in 1 year  Colon: ifob yearly, neg 08/2018... will pick up stool cards  vaccines: reviewed, COVID19 uptodate, refused tdap   Aortic atherosclerosis ([place])  On statin. Risk factor reduction.  High cholesterol Work on increasing exercise, continue atorvastatin 20 mg daily.Marland Kitchen if not at goal at next check consider increase in dose of atorvastatin.  Chronic low back pain No red flags. Tramadol prn.  Osteopenia Repeat DEXA. Recommend weight bearing exercise, calcium in diet and vit D supplement 400 IU 1-2 times daily.    Hypothyroidism Will re-eval in 3 months.. hold of on lowering dose unless persistently elevated TSH given asymptomatic.   Eliezer Lofts, MD

## 2019-08-14 NOTE — Assessment & Plan Note (Signed)
Will re-eval in 3 months.. hold of on lowering dose unless persistently elevated TSH given asymptomatic.

## 2019-08-14 NOTE — Patient Instructions (Addendum)
Return in 3-6 months for thyroid recheck.   Get back to walking 3-5 days.  Avoid reflux triggers. Can use pepcid AC for acute flares as long as not frequent.  If reflux becomes more frequent.. start prilosec 2 x 20 mg tablets for 4-6 weeks.  Check Blood pressure every few days... Goal < 140/90

## 2019-08-14 NOTE — Assessment & Plan Note (Signed)
Work on increasing exercise, continue atorvastatin 20 mg daily.Marland Kitchen if not at goal at next check consider increase in dose of atorvastatin.

## 2019-08-14 NOTE — Assessment & Plan Note (Signed)
No red flags. Tramadol prn.

## 2019-08-14 NOTE — Assessment & Plan Note (Signed)
Repeat DEXA. Recommend weight bearing exercise, calcium in diet and vit D supplement 400 IU 1-2 times daily.

## 2019-08-14 NOTE — Assessment & Plan Note (Signed)
On statin. Risk factor reduction.

## 2019-08-26 DIAGNOSIS — Z1211 Encounter for screening for malignant neoplasm of colon: Secondary | ICD-10-CM | POA: Diagnosis not present

## 2019-08-26 DIAGNOSIS — Z1212 Encounter for screening for malignant neoplasm of rectum: Secondary | ICD-10-CM | POA: Diagnosis not present

## 2019-08-26 LAB — COLOGUARD: Cologuard: NEGATIVE

## 2019-09-02 ENCOUNTER — Encounter: Payer: Self-pay | Admitting: Family Medicine

## 2019-09-14 ENCOUNTER — Other Ambulatory Visit: Payer: Self-pay | Admitting: Family Medicine

## 2019-09-15 NOTE — Telephone Encounter (Signed)
Last office visit 08/14/2019 for [name].  Last refilled 08/05/2019 for #60 with no refills.  No future appointments.

## 2019-11-10 ENCOUNTER — Other Ambulatory Visit: Payer: Self-pay | Admitting: Family Medicine

## 2019-11-10 NOTE — Telephone Encounter (Signed)
Last office visit 08/14/2019 for [name]. Last refilled 09/16/2019 for #60 with no refills.  No future appointments.

## 2019-12-20 ENCOUNTER — Other Ambulatory Visit: Payer: Self-pay | Admitting: Family Medicine

## 2019-12-22 NOTE — Telephone Encounter (Signed)
Last office visit 08/14/2019 for [name].  Last refilled 11/10/2019 for #60 with no refills.  No future appointments.

## 2020-01-01 ENCOUNTER — Other Ambulatory Visit: Payer: Self-pay | Admitting: Family Medicine

## 2020-01-01 DIAGNOSIS — Z1231 Encounter for screening mammogram for malignant neoplasm of breast: Secondary | ICD-10-CM

## 2020-01-09 ENCOUNTER — Ambulatory Visit: Payer: Medicare Other | Attending: Internal Medicine

## 2020-01-09 ENCOUNTER — Other Ambulatory Visit: Payer: Self-pay | Admitting: Internal Medicine

## 2020-01-09 DIAGNOSIS — Z23 Encounter for immunization: Secondary | ICD-10-CM

## 2020-01-09 NOTE — Progress Notes (Signed)
   [facility]  Name:  Stephanie Kemp    MRN: [id number] DOB: 26-Jan-1946  01/09/2020  [name] was observed post Covid-19 immunization for 15 minutes without incident. She was provided with Vaccine Information Sheet and instruction to access the V-Safe system.   [name] was instructed to call 911 with any severe reactions post vaccine: Marland Kitchen Difficulty breathing  . Swelling of face and throat  . A fast heartbeat  . A bad rash all over body  . Dizziness and weakness

## 2020-01-23 ENCOUNTER — Other Ambulatory Visit: Payer: Self-pay | Admitting: Family Medicine

## 2020-01-23 NOTE — Telephone Encounter (Signed)
Last office visit 08/14/2019 for [name].  Last refilled 12/22/2019 for #60 with no refills.  No future appointments.

## 2020-02-02 ENCOUNTER — Other Ambulatory Visit: Payer: Self-pay | Admitting: Family Medicine

## 2020-02-24 ENCOUNTER — Ambulatory Visit
Admission: RE | Admit: 2020-02-24 | Discharge: 2020-02-24 | Disposition: A | Discharge status: Home / Self Care | Payer: Medicare Other | Source: Ambulatory Visit | Attending: Family Medicine | Admitting: Family Medicine

## 2020-02-24 ENCOUNTER — Other Ambulatory Visit: Payer: Self-pay

## 2020-02-24 DIAGNOSIS — Z1231 Encounter for screening mammogram for malignant neoplasm of breast: Secondary | ICD-10-CM | POA: Diagnosis not present

## 2020-02-27 NOTE — Progress Notes (Signed)
No critical labs need to be addressed urgently. We will discuss labs in detail at upcoming office visit.   

## 2020-04-23 ENCOUNTER — Other Ambulatory Visit: Payer: Self-pay | Admitting: Family Medicine

## 2020-04-23 NOTE — Telephone Encounter (Signed)
Last office visit 08/14/2019 for [name].  Last refilled 01/23/2020 for #60 with no refills.  No future appointments.

## 2020-06-08 ENCOUNTER — Other Ambulatory Visit: Payer: Self-pay | Admitting: Family Medicine

## 2020-06-08 NOTE — Telephone Encounter (Signed)
Last office visit 08/14/2019 for [name].  Last refilled 04/23/2020 for #60 with no refills.  No future appointments.

## 2020-07-13 ENCOUNTER — Other Ambulatory Visit: Payer: Self-pay | Admitting: Family Medicine

## 2020-07-14 NOTE — Telephone Encounter (Signed)
Last office visit 08/14/2019 for [name].  Last refilled 06/08/2020 for #60 with no refills.  No future appointments.

## 2020-08-09 ENCOUNTER — Other Ambulatory Visit: Payer: Self-pay | Admitting: Family Medicine

## 2020-08-09 NOTE — Telephone Encounter (Signed)
Last office visit 08/14/2019 for [name].  Last refilled 07/14/2020 for #60 with no refills.  No future appointments.

## 2020-09-14 ENCOUNTER — Other Ambulatory Visit: Payer: Self-pay | Admitting: Family Medicine

## 2020-09-14 NOTE — Telephone Encounter (Signed)
Last office visit 08/14/2019 for [name].  Last refilled 08/09/2020 for #60 with no refills.  No future appointments.

## 2020-09-17 ENCOUNTER — Other Ambulatory Visit: Payer: Self-pay

## 2020-09-17 ENCOUNTER — Encounter: Payer: Self-pay | Admitting: Internal Medicine

## 2020-09-17 ENCOUNTER — Ambulatory Visit (INDEPENDENT_AMBULATORY_CARE_PROVIDER_SITE_OTHER): Payer: Medicare Other | Admitting: Internal Medicine

## 2020-09-17 VITALS — BP 140/90 | HR 101 | Temp 97.8°F | Ht 65.0 in | Wt 161.5 lb

## 2020-09-17 DIAGNOSIS — R300 Dysuria: Secondary | ICD-10-CM | POA: Insufficient documentation

## 2020-09-17 LAB — POC URINALSYSI DIPSTICK (AUTOMATED)
Bilirubin, UA: NEGATIVE
Blood, UA: NEGATIVE
Glucose, UA: NEGATIVE
Ketones, UA: NEGATIVE
Leukocytes, UA: NEGATIVE
Nitrite, UA: NEGATIVE
Protein, UA: POSITIVE — AB
Spec Grav, UA: 1.02 (ref 1.010–1.025)
Urobilinogen, UA: 0.2 E.U./dL
pH, UA: 6.5 (ref 5.0–8.0)

## 2020-09-17 MED ORDER — NITROFURANTOIN MONOHYD MACRO 100 MG PO CAPS: 100.0000 mg | ORAL_CAPSULE | Freq: Two times a day (BID) | ORAL | 1 refills | Status: DC

## 2020-09-17 NOTE — Assessment & Plan Note (Signed)
With change in urine and other symptoms Urinalysis is benign Will try 3 days of macrodantin

## 2020-09-17 NOTE — Progress Notes (Signed)
Subjective:    Patient ID: Stephanie Kemp, female    DOB: 21-Dec-1945, 75 y.o.   MRN: [id number]  HPI Here due to urinary symptoms and fatigue This visit occurred during the SARS-CoV-2 public health emergency.  Safety protocols were in place, including screening questions prior to the visit, additional usage of staff PPE, and extensive cleaning of exam room while observing appropriate contact time as indicated for disinfecting solutions.   Thinks she has a urinary infection Her urine looks milky and has sediment Feels tired Some burning dysuria---started gradually a couple of weeks ago No blood  Frequent bladder infections when she was younger Hasn't tried anything for it  Current Outpatient Medications on File Prior to Visit  Medication Sig Dispense Refill   atorvastatin (LIPITOR) 20 MG tablet Take 1 tablet by mouth once daily 90 tablet 3   Calcium Citrate-Vitamin D (CALCIUM + D PO) Take 1 capsule by mouth 2 (two) times daily.     cholecalciferol (VITAMIN D3) 25 MCG (1000 UT) tablet Take 1,000 Units by mouth daily.     diphenhydramine-acetaminophen (TYLENOL PM) 25-500 MG TABS tablet Take 1 tablet by mouth at bedtime as needed.     EUTHYROX 137 MCG tablet Take 1 tablet by mouth once daily 90 tablet 0   Omega-3 Fatty Acids (FISH OIL PO) Take by mouth daily.     traMADol (ULTRAM) 50 MG tablet TAKE 1 TABLET BY MOUTH EVERY 12 HOURS AS NEEDED 60 tablet 0   No current facility-administered medications on file prior to visit.    Allergies  Allergen Reactions   Sulfonamide Derivatives Rash    REACTION: stomach pain    Past Medical History:  Diagnosis Date   Arthritis    lower back, left hip   Degenerative disc disease, lumbar    Headache    migraines.  none in several yrs   Hypothyroidism    Pericardial effusion    approx 2010. Unknown etiology. resolved.   Refusal of blood transfusions as patient is Jehovah's Witness    S/P dilatation and curettage     Past Surgical  History:  Procedure Laterality Date   BROW LIFT Bilateral 11/23/2015   Procedure: BLEPHAROPLASTY, upper;  Surgeon: Imagene Riches, MD;  Location: [facility];  Service: Ophthalmology;  Laterality: Bilateral;   DENTAL SURGERY  04/2018   DILATION AND CURETTAGE OF UTERUS     ECTROPION REPAIR Bilateral 11/23/2015   Procedure: REPAIR OF ECTROPION, lower;  Surgeon: Imagene Riches, MD;  Location: [facility];  Service: Ophthalmology;  Laterality: Bilateral;   PTOSIS REPAIR Bilateral 11/23/2015   Procedure: PTOSIS REPAIR, upper;  Surgeon: Imagene Riches, MD;  Location: [facility];  Service: Ophthalmology;  Laterality: Bilateral;   REPAIR NONUNION / DEFECT OF RADIUS / ULNA     Repair Right    Family History  Problem Relation Age of Onset   Heart disease Mother        MI age 24's   Parkinsonism Mother        ?   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 Has 7 grandchildren. She  does not get any regular exercise. She does skip breakfast and eats a small amount with frequent snacks. She drinks lots of water and eats a large amount of fruits and vegetables.     Social Determinants of Health   Financial Resource Strain: Not on file  Food Insecurity: Not on file  Transportation Needs: Not on file  Physical Activity: Not on file  Stress: Not on file  Social Connections: Not on file  Intimate Partner Violence: Not on file   Review of Systems No fever or sweats Some chills Chronic back pain from disc disease---nothing new    Objective:   Physical Exam Constitutional:      Appearance: Normal appearance.  Abdominal:     Palpations: Abdomen is soft.     Tenderness: There is no abdominal tenderness. There is no right CVA tenderness or left CVA tenderness.  Neurological:     Mental Status: She is alert.           Assessment & Plan:

## 2020-10-11 ENCOUNTER — Other Ambulatory Visit: Payer: Self-pay | Admitting: Family Medicine

## 2020-10-11 NOTE — Telephone Encounter (Signed)
Last office visit 09/17/2020 with Dr. Alphonsus Sias for dysuria.  Last refilled 09/14/2020 for #60 with no refills.  No future appointments.    Please call and schedule [name] with Calandra and 40 minute CPE with fasting labs prior for Dr. Ermalene Searing.

## 2020-10-12 NOTE — Telephone Encounter (Signed)
Left voice message the call the office

## 2020-10-13 ENCOUNTER — Telehealth: Payer: Self-pay

## 2020-10-13 NOTE — Telephone Encounter (Signed)
[facility] Night - Client Nonclinical Telephone Record AccessNurse Client [facility] Night - Client Client Site  [facility] [place] - Night Physician Kerby Nora - MD Contact Type Call Who Is Calling Patient / Member / Family / Caregiver Caller Name Stephanie Kemp Caller Phone Number [phone] Patient Name Stephanie Kemp Patient DOB 1945-05-05 Call Type Message Only Information Provided Reason for Call Request to Schedule Office Appointment Initial Comment Caller needing to schedule an appt for an annual check up and physical for refills Patient request to speak to RN No Disp. Time Disposition Final User 10/12/2020 5:28:38 PM General Information Provided Yes Tiburcio Pea, Lanette Call Closed By: Evette Doffing Transaction Date/Time: 10/12/2020 5:26:26 PM (ET)

## 2020-10-13 NOTE — Telephone Encounter (Signed)
Spoke with patient schedule [name] with the nurse CPE with labs prior

## 2020-10-13 NOTE — Telephone Encounter (Signed)
Last office visit 09/17/2020 with Dr. Alphonsus Sias for Dysuria.  Last refilled 09/14/2020 for #60 with no refills.  CPE scheduled 11/18/20.

## 2020-11-06 ENCOUNTER — Other Ambulatory Visit: Payer: Self-pay | Admitting: Family Medicine

## 2020-11-10 ENCOUNTER — Telehealth: Payer: Self-pay | Admitting: Family Medicine

## 2020-11-10 DIAGNOSIS — E7800 Pure hypercholesterolemia, unspecified: Secondary | ICD-10-CM

## 2020-11-10 DIAGNOSIS — E038 Other specified hypothyroidism: Secondary | ICD-10-CM

## 2020-11-10 DIAGNOSIS — E559 Vitamin D deficiency, unspecified: Secondary | ICD-10-CM

## 2020-11-10 NOTE — Telephone Encounter (Signed)
labs

## 2020-11-11 ENCOUNTER — Ambulatory Visit: Payer: Medicare Other

## 2020-11-11 ENCOUNTER — Other Ambulatory Visit (INDEPENDENT_AMBULATORY_CARE_PROVIDER_SITE_OTHER): Payer: Medicare Other

## 2020-11-11 ENCOUNTER — Other Ambulatory Visit: Payer: Self-pay

## 2020-11-11 DIAGNOSIS — E559 Vitamin D deficiency, unspecified: Secondary | ICD-10-CM

## 2020-11-11 DIAGNOSIS — E038 Other specified hypothyroidism: Secondary | ICD-10-CM

## 2020-11-11 DIAGNOSIS — E7800 Pure hypercholesterolemia, unspecified: Secondary | ICD-10-CM | POA: Diagnosis not present

## 2020-11-11 LAB — COMPREHENSIVE METABOLIC PANEL
ALT: 19 U/L (ref 0–35)
AST: 19 U/L (ref 0–37)
Albumin: 4 g/dL (ref 3.5–5.2)
Alkaline Phosphatase: 53 U/L (ref 39–117)
BUN: 12 mg/dL (ref 6–23)
CO2: 29 mEq/L (ref 19–32)
Calcium: 9.1 mg/dL (ref 8.4–10.5)
Chloride: 105 mEq/L (ref 96–112)
Creatinine, Ser: 1.11 mg/dL (ref 0.40–1.20)
GFR: 49.29 mL/min — ABNORMAL LOW (ref >60.00)
Glucose, Bld: 77 mg/dL (ref 70–99)
Potassium: 4.3 mEq/L (ref 3.5–5.1)
Sodium: 141 mEq/L (ref 135–145)
Total Bilirubin: 0.6 mg/dL (ref 0.2–1.2)
Total Protein: 6.3 g/dL (ref 6.0–8.3)

## 2020-11-11 LAB — T4, FREE: Free T4: 1.11 ng/dL (ref 0.60–1.60)

## 2020-11-11 LAB — LIPID PANEL
Cholesterol: 154 mg/dL (ref 0–200)
HDL: 61.3 mg/dL (ref >39.00)
LDL Cholesterol: 78 mg/dL (ref 0–99)
NonHDL: 92.31
Total CHOL/HDL Ratio: 3
Triglycerides: 73 mg/dL (ref 0.0–149.0)
VLDL: 14.6 mg/dL (ref 0.0–40.0)

## 2020-11-11 LAB — TSH: TSH: 0.09 u[IU]/mL — ABNORMAL LOW (ref 0.35–5.50)

## 2020-11-11 LAB — VITAMIN D 25 HYDROXY (VIT D DEFICIENCY, FRACTURES): VITD: 35.43 ng/mL (ref 30.00–100.00)

## 2020-11-11 LAB — T3, FREE: T3, Free: 2.9 pg/mL (ref 2.3–4.2)

## 2020-11-11 NOTE — Progress Notes (Signed)
No critical labs need to be addressed urgently. We will discuss labs in detail at upcoming office visit.   

## 2020-11-18 ENCOUNTER — Ambulatory Visit (INDEPENDENT_AMBULATORY_CARE_PROVIDER_SITE_OTHER): Payer: Medicare Other | Admitting: Family Medicine

## 2020-11-18 ENCOUNTER — Encounter: Payer: Self-pay | Admitting: Family Medicine

## 2020-11-18 ENCOUNTER — Other Ambulatory Visit: Payer: Self-pay

## 2020-11-18 VITALS — BP 120/80 | HR 91 | Temp 98.9°F | Ht 64.0 in | Wt 163.2 lb

## 2020-11-18 DIAGNOSIS — E559 Vitamin D deficiency, unspecified: Secondary | ICD-10-CM | POA: Diagnosis not present

## 2020-11-18 DIAGNOSIS — I700 Atherosclerosis of aorta: Secondary | ICD-10-CM

## 2020-11-18 DIAGNOSIS — M8588 Other specified disorders of bone density and structure, other site: Secondary | ICD-10-CM

## 2020-11-18 DIAGNOSIS — Z23 Encounter for immunization: Secondary | ICD-10-CM | POA: Diagnosis not present

## 2020-11-18 DIAGNOSIS — E7800 Pure hypercholesterolemia, unspecified: Secondary | ICD-10-CM | POA: Diagnosis not present

## 2020-11-18 DIAGNOSIS — Z0000 Encounter for general adult medical examination without abnormal findings: Secondary | ICD-10-CM

## 2020-11-18 DIAGNOSIS — M8580 Other specified disorders of bone density and structure, unspecified site: Secondary | ICD-10-CM

## 2020-11-18 DIAGNOSIS — E038 Other specified hypothyroidism: Secondary | ICD-10-CM

## 2020-11-18 NOTE — Assessment & Plan Note (Signed)
Stable, chronic.  Continue current medication.   Levo 137 mcg daily

## 2020-11-18 NOTE — Assessment & Plan Note (Signed)
Stable, chronic.  Continue current medication. ? ? ?Atorvastatin 20 mg daily ?

## 2020-11-18 NOTE — Assessment & Plan Note (Signed)
At goal on supplement. 

## 2020-11-18 NOTE — Progress Notes (Signed)
Patient ID: Stephanie Kemp, female    DOB: 1945/08/23, 75 y.o.   MRN: [id number]  This visit was conducted in person.  BP 120/80   Pulse 91   Temp 98.9 F (37.2 C) (Temporal)   Ht 5\' 4"  (1.626 m)   Wt 163 lb 4 oz (74 kg)   SpO2 96%   BMI 28.02 kg/m    CC: Chief Complaint  Patient presents with   Medicare Wellness    Subjective:   HPI: Stephanie Kemp is a 75 y.o. female presenting on 11/18/2020 for Medicare Wellness The patient presents for annual medicare wellness, complete physical and review of chronic health problems. He/She also has the following acute concerns today:  I have personally reviewed the Medicare Annual Wellness questionnaire and have noted 1. The patient's medical and social history 2. Their use of alcohol, tobacco or illicit drugs 3. Their current medications and supplements 4. The patient's functional ability including ADL's, fall risks, home safety risks and hearing or visual             impairment. 5. Diet and physical activities 6. Evidence for depression or mood disorders 7.         Updated provider list Cognitive evaluation was performed and recorded on pt medicare questionnaire form. The patients weight, height, BMI and visual acuity have been recorded in the chart   I have made referrals, counseling and provided education to the patient based review of the above and I have provided the pt with a written personalized care plan for preventive services.   Documentation of this information was scanned into the electronic record under the media tab.  Hearing Screening  Method: Audiometry   500Hz  1000Hz  2000Hz  4000Hz   Right ear 20 20 20 20   Left ear 20 20 20 20    Vision Screening   Right eye Left eye Both eyes  Without correction 20/50 20/25 20/30   With correction       Fall Risk  11/18/2020 08/14/2019 08/07/2018 07/24/2017 07/20/2016  Falls in the past year? 0 0 0 No Yes  Comment - - - - Fall in Summer 2017 as result of shoe soles  Number falls  in past yr: - - - - 1  Injury with Fall? - - - - Yes    Flowsheet Row Office Visit from 11/18/2020 in [facility] Total Score 0        Advance directives and end of life planning reviewed in detail with patient and documented in EMR. Patient given handout on advance care directives if needed. HCPOA and living will updated if needed.  Aortic atherosclerosis: on statin, risk factor reduction.   Elevated Cholesterol:Almost at  goal LDL < 70 on atorvastatin 20 mg  Lab Results  Component Value Date   CHOL 154 11/11/2020   HDL 61.30 11/11/2020   LDLCALC 78 11/11/2020   LDLDIRECT 145.3 10/11/2012   TRIG 73.0 11/11/2020   CHOLHDL 3 11/11/2020  Using medications without problems:none Muscle aches: none Diet compliance: heart healthy Exercise: 3-4 times a week walking 30 min Other complaints:   BP Readings from Last 3 Encounters:  11/18/20 120/80  09/17/20 140/90  08/14/19 (!) 146/89   Vit D def: normal on suppement  Chronic low back pain ON tramadol Using  once a day prn.    Hypothyroid Normal free t3 and t4  Lab Results  Component Value Date   TSH 0.09 (L) 11/11/2020  Relevant past medical, surgical, family and social history reviewed and updated as indicated. Interim medical history since our last visit reviewed. Allergies and medications reviewed and updated. Outpatient Medications Prior to Visit  Medication Sig Dispense Refill   atorvastatin (LIPITOR) 20 MG tablet Take 1 tablet by mouth once daily 90 tablet 0   Calcium Citrate-Vitamin D (CALCIUM + D PO) Take 1 capsule by mouth 2 (two) times daily.     cholecalciferol (VITAMIN D3) 25 MCG (1000 UT) tablet Take 1,000 Units by mouth daily.     diphenhydramine-acetaminophen (TYLENOL PM) 25-500 MG TABS tablet Take 1 tablet by mouth at bedtime as needed.     EUTHYROX 137 MCG tablet Take 1 tablet by mouth once daily 90 tablet 0   Omega-3 Fatty Acids (FISH OIL PO) Take by mouth daily.      traMADol (ULTRAM) 50 MG tablet TAKE 1 TABLET BY MOUTH EVERY 12 HOURS AS NEEDED 60 tablet 0   nitrofurantoin, macrocrystal-monohydrate, (MACROBID) 100 MG capsule Take 1 capsule (100 mg total) by mouth 2 (two) times daily. 6 capsule 1   No facility-administered medications prior to visit.     Per HPI unless specifically indicated in ROS section below Review of Systems  Constitutional:  Negative for fatigue and fever.  HENT:  Negative for congestion and ear pain.   Eyes:  Negative for pain.  Respiratory:  Negative for cough, chest tightness and shortness of breath.   Cardiovascular:  Negative for chest pain, palpitations and leg swelling.  Gastrointestinal:  Negative for abdominal pain.  Genitourinary:  Negative for dysuria and vaginal bleeding.  Musculoskeletal:  Negative for back pain.  Neurological:  Negative for syncope, light-headedness and headaches.  Psychiatric/Behavioral:  Negative for dysphoric mood.   Objective:  BP 120/80   Pulse 91   Temp 98.9 F (37.2 C) (Temporal)   Ht 5\' 4"  (1.626 m)   Wt 163 lb 4 oz (74 kg)   SpO2 96%   BMI 28.02 kg/m   Wt Readings from Last 3 Encounters:  11/18/20 163 lb 4 oz (74 kg)  09/17/20 161 lb 8 oz (73.3 kg)  08/14/19 163 lb 4 oz (74 kg)      Physical Exam Constitutional:      General: She is not in acute distress.    Appearance: Normal appearance. She is well-developed. She is not ill-appearing or toxic-appearing.  HENT:     Head: Normocephalic.     Right Ear: Hearing, tympanic membrane, ear canal and external ear normal. Tympanic membrane is not erythematous, retracted or bulging.     Left Ear: Hearing, tympanic membrane, ear canal and external ear normal. Tympanic membrane is not erythematous, retracted or bulging.     Nose: No mucosal edema or rhinorrhea.     Right Sinus: No maxillary sinus tenderness or frontal sinus tenderness.     Left Sinus: No maxillary sinus tenderness or frontal sinus tenderness.     Mouth/Throat:      Pharynx: Uvula midline.  Eyes:     General: Lids are normal. Lids are everted, no foreign bodies appreciated.     Conjunctiva/sclera: Conjunctivae normal.     Pupils: Pupils are equal, round, and reactive to light.  Neck:     Thyroid: No thyroid mass or thyromegaly.     Vascular: No carotid bruit.     Trachea: Trachea normal.  Cardiovascular:     Rate and Rhythm: Normal rate and regular rhythm.     Pulses: Normal pulses.  Heart sounds: Normal heart sounds, S1 normal and S2 normal. No murmur heard.   No friction rub. No gallop.  Pulmonary:     Effort: Pulmonary effort is normal. No tachypnea or respiratory distress.     Breath sounds: Normal breath sounds. No decreased breath sounds, wheezing, rhonchi or rales.  Abdominal:     General: Bowel sounds are normal.     Palpations: Abdomen is soft.     Tenderness: There is no abdominal tenderness.  Musculoskeletal:     Cervical back: Normal range of motion and neck supple.  Skin:    General: Skin is warm and dry.     Findings: No rash.  Neurological:     Mental Status: She is alert.  Psychiatric:        Mood and Affect: Mood is not anxious or depressed.        Speech: Speech normal.        Behavior: Behavior normal. Behavior is cooperative.        Thought Content: Thought content normal.        Judgment: Judgment normal.      Results for orders placed or performed in visit on 11/11/20  Comprehensive metabolic panel  Result Value Ref Range   Sodium 141 135 - 145 mEq/L   Potassium 4.3 3.5 - 5.1 mEq/L   Chloride 105 96 - 112 mEq/L   CO2 29 19 - 32 mEq/L   Glucose, Bld 77 70 - 99 mg/dL   BUN 12 6 - 23 mg/dL   Creatinine, Ser 7.32 0.40 - 1.20 mg/dL   Total Bilirubin 0.6 0.2 - 1.2 mg/dL   Alkaline Phosphatase 53 39 - 117 U/L   AST 19 0 - 37 U/L   ALT 19 0 - 35 U/L   Total Protein 6.3 6.0 - 8.3 g/dL   Albumin 4.0 3.5 - 5.2 g/dL   GFR 20.25 (L) >42.70 mL/min   Calcium 9.1 8.4 - 10.5 mg/dL  T3, free  Result Value Ref Range    T3, Free 2.9 2.3 - 4.2 pg/mL  T4, free  Result Value Ref Range   Free T4 1.11 0.60 - 1.60 ng/dL  TSH  Result Value Ref Range   TSH 0.09 (L) 0.35 - 5.50 uIU/mL  VITAMIN D 25 Hydroxy (Vit-D Deficiency, Fractures)  Result Value Ref Range   VITD 35.43 30.00 - 100.00 ng/mL  Lipid panel  Result Value Ref Range   Cholesterol 154 0 - 200 mg/dL   Triglycerides 62.3 0.0 - 149.0 mg/dL   HDL 76.28 >31.51 mg/dL   VLDL 76.1 0.0 - 60.7 mg/dL   LDL Cholesterol 78 0 - 99 mg/dL   Total CHOL/HDL Ratio 3    NonHDL 92.31     This visit occurred during the SARS-CoV-2 public health emergency.  Safety protocols were in place, including screening questions prior to the visit, additional usage of staff PPE, and extensive cleaning of exam room while observing appropriate contact time as indicated for disinfecting solutions.   COVID 19 screen:  No recent travel or known exposure to COVID19 The patient denies respiratory symptoms of COVID 19 at this time. The importance of social distancing was discussed today.   Assessment and Plan The patient's preventative maintenance and recommended screening tests for an annual wellness exam were reviewed in full today. Brought up to date unless services declined.  Counselled on the importance of diet, exercise, and its role in overall health and mortality. The patient's FH and SH was reviewed,  including their home life, tobacco status, and drug and alcohol status.   PAP/DVE:last pap 2013 nml, no longer indicated.  Asymptomatic. DVE: low risk, no symptoms and no family history of Uterine and ovarian  Hep C done  DEXA: 2017 osteopenia, repeat in 5 years.. due in 2022  mammo: 02/24/2020 Colon:  cologuard neg 2021 vaccines: reviewed, COVID19 x 3 uptodate, uptodate Tdap   Given flu shot today.   Problem List Items Addressed This Visit     Aortic atherosclerosis (HCC)    ON statin, CVD risk reduction.      High cholesterol    Stable, chronic.  Continue current  medication.  Atorvastatin 20 mg daily.      Hypothyroidism    Stable, chronic.  Continue current medication.   Levo 137 mcg daily      Osteopenia    Due for q5 year DEXA      Relevant Orders   DG Bone Density   Vitamin D deficiency    At goal on supplement      Other Visit Diagnoses     Medicare annual wellness visit, subsequent    -  Primary   Need for influenza vaccination       Relevant Orders   Flu Vaccine QUAD High Dose(Fluad) (Completed)   Other specified disorders of bone density and structure, other site        Relevant Orders   DG Bone Density        Stephanie NoraAmy Kemonie Cutillo, MD

## 2020-11-18 NOTE — Assessment & Plan Note (Signed)
ON statin, CVD risk reduction.

## 2020-11-18 NOTE — Assessment & Plan Note (Signed)
Due for q5 year DEXA

## 2020-11-18 NOTE — Patient Instructions (Addendum)
Consider 4th dose COVID and shingrix.Marland Kitchen at least 1 week after high dose flu shot. Please call the location of your choice from the menu below to schedule your Mammogram and/or Bone Density appointment.   AFTER 02/23/2021   [facility]   Phone:  [phone]   [address]                                                                            [place], [place] [address]                                            Services: 3D Mammogram and Bone [facility])  Phone:  [phone]   [address]                        [place], [place] [address]                                              Services:  3D Mammogram and Bone Density

## 2020-11-29 ENCOUNTER — Other Ambulatory Visit: Payer: Self-pay | Admitting: Family Medicine

## 2020-11-29 NOTE — Telephone Encounter (Signed)
Last office visit [name].  Last refilled 10/13/2020 for #60 with no refills.  CPE scheduled for 11/24/2021.

## 2021-01-19 ENCOUNTER — Other Ambulatory Visit: Payer: Self-pay | Admitting: Family Medicine

## 2021-01-19 NOTE — Telephone Encounter (Signed)
Last office visit 11/18/2020 for [name].  Last refilled 11/29/2020 for #60 with no refills.  CPE scheduled 11/24/2021.

## 2021-01-21 ENCOUNTER — Telehealth: Payer: Self-pay | Admitting: *Deleted

## 2021-01-21 NOTE — Telephone Encounter (Signed)
[facility] notified okay to change manufacturers via fax.

## 2021-01-21 NOTE — Telephone Encounter (Signed)
Received fax from [facility] asking is it ok to change MFG for patients levothyroxine Rx?

## 2021-01-21 NOTE — Telephone Encounter (Signed)
OK to make change but have labs 4-6 weeks following.

## 2021-01-24 NOTE — Telephone Encounter (Signed)
Stephanie Kemp notified as instructed by telephone.  She will call back in 4 to 6 weeks to schedule lab appointment to check thyroid labs.

## 2021-01-24 NOTE — Telephone Encounter (Signed)
Left message for Stephanie Kemp to return my call in regards to her levothyroxine prescription.

## 2021-01-31 ENCOUNTER — Other Ambulatory Visit: Payer: Self-pay | Admitting: Family Medicine

## 2021-01-31 DIAGNOSIS — Z1231 Encounter for screening mammogram for malignant neoplasm of breast: Secondary | ICD-10-CM

## 2021-02-04 ENCOUNTER — Other Ambulatory Visit: Payer: Self-pay | Admitting: Family Medicine

## 2021-02-10 ENCOUNTER — Other Ambulatory Visit: Payer: Self-pay | Admitting: Family Medicine

## 2021-02-13 NOTE — Telephone Encounter (Signed)
Last office 11/18/20 for [name].  Last refilled 01/19/21 for #60 with no refills.  CPE scheduled 11/24/21.

## 2021-02-14 DIAGNOSIS — Z20822 Contact with and (suspected) exposure to covid-19: Secondary | ICD-10-CM | POA: Diagnosis not present

## 2021-02-24 ENCOUNTER — Ambulatory Visit
Admission: RE | Admit: 2021-02-24 | Discharge: 2021-02-24 | Disposition: A | Discharge status: Home / Self Care | Payer: Medicare Other | Source: Ambulatory Visit | Attending: Family Medicine | Admitting: Family Medicine

## 2021-02-24 ENCOUNTER — Other Ambulatory Visit: Payer: Self-pay

## 2021-02-24 DIAGNOSIS — Z1231 Encounter for screening mammogram for malignant neoplasm of breast: Secondary | ICD-10-CM | POA: Diagnosis not present

## 2021-04-22 ENCOUNTER — Other Ambulatory Visit: Payer: Self-pay | Admitting: Family Medicine

## 2021-04-24 NOTE — Telephone Encounter (Signed)
Last office visit 11/18/20 for [name].  Last refilled 02/14/21 for #60 with refills.  CPE scheduled 11/24/21.

## 2021-04-26 ENCOUNTER — Other Ambulatory Visit: Payer: Self-pay | Admitting: Family Medicine

## 2021-04-26 DIAGNOSIS — G8929 Other chronic pain: Secondary | ICD-10-CM

## 2021-04-27 NOTE — Telephone Encounter (Signed)
Pharmacy comment: Insurance requires a diagnosis code for the chronic pain diagnosis. Please resend with diagnosis code. Thanks.

## 2021-04-28 MED ORDER — TRAMADOL HCL 50 MG PO TABS: 50.0000 mg | ORAL_TABLET | Freq: Two times a day (BID) | ORAL | 0 refills | Status: DC | PRN

## 2021-04-28 NOTE — Addendum Note (Signed)
Addended by: Damita Lack on: 04/28/2021 12:10 PM   Modules accepted: Orders

## 2021-04-28 NOTE — Telephone Encounter (Signed)
Did not go through to pharmacy.  Refill error.  Please re-sign refill.

## 2021-06-14 ENCOUNTER — Other Ambulatory Visit: Payer: Self-pay | Admitting: Family Medicine

## 2021-06-14 DIAGNOSIS — M545 Low back pain, unspecified: Secondary | ICD-10-CM

## 2021-06-15 NOTE — Telephone Encounter (Signed)
Last office visit 11/18/2020 for [name].  Last refilled 04/28/2021 for #60 with no refills.  CPE scheduled 11/24/2021. ?

## 2021-08-04 ENCOUNTER — Other Ambulatory Visit: Payer: Self-pay | Admitting: Family Medicine

## 2021-08-04 DIAGNOSIS — M545 Low back pain, unspecified: Secondary | ICD-10-CM

## 2021-08-05 NOTE — Telephone Encounter (Signed)
Last office visit 11/18/20 for [name].   Last refilled 05/26/21 for #60 with no refills.  CPE scheduled 11/24/21.

## 2021-08-05 NOTE — Telephone Encounter (Signed)
Please see refill note below access nurse note; thank you.   [facility] Night - Client TELEPHONE ADVICE RECORD AccessNurse Patient Name: Stephanie Kemp Gender: Female DOB: December 11, 1945 Age: 76 Y 3 M 18 D Return Phone Number: [phone] (Primary) Address: City/ State/ Zip: [place]  [address] Client [facility] Night - Client Client Site [name] Provider Eliezer Lofts - MD Contact Type Call Who Is Calling Patient / Member / Family / Caregiver Call Type Triage / Clinical Relationship To Patient Self Return Phone Number [phone] (Primary) Chief Complaint Back Pain - General Reason for Call Medication Question / Request Initial Comment Patient needs the doctor to call in authorization for her Tramadol 50 mg for arthritis and osteoporosis. She reports back pain, stated it is not severe. Translation No Disp. Time [name] Time) Disposition Final User 08/04/2021 5:45:00 PM Attempt made - message left Dema Severin, RN, Merleen Nicely 08/04/2021 6:02:24 PM FINAL ATTEMPT MADE - message left Yes White, RN, Dallas Schimke

## 2021-09-15 ENCOUNTER — Other Ambulatory Visit: Payer: Self-pay | Admitting: Family Medicine

## 2021-09-15 DIAGNOSIS — M545 Low back pain, unspecified: Secondary | ICD-10-CM

## 2021-09-15 NOTE — Telephone Encounter (Signed)
Last office visit 11/18/2020 for [name].  Last refilled 08/05/2021 for #60 with no refills. CPE scheduled 12/01/2021.

## 2021-11-03 ENCOUNTER — Other Ambulatory Visit: Payer: Self-pay | Admitting: Family Medicine

## 2021-11-03 DIAGNOSIS — G8929 Other chronic pain: Secondary | ICD-10-CM

## 2021-11-03 NOTE — Telephone Encounter (Signed)
[facility] Day - Client Nonclinical Telephone Record  AccessNurse Client [facility] Day - Client Client Site [facility] - Day Provider Kerby Nora - MD Contact Type Call Who Is Calling Patient / Member / Family / Caregiver Caller Name Carmyn Hamm Caller Phone Number [phone] Patient Name Stephanie Kemp Patient DOB 12-25-1945 Call Type Message Only Information Provided Reason for Call Medication Question / Request Initial Comment Caller needs a Tramadol refill, no symptoms. Disp. Time Disposition Final User 11/03/2021 8:02:31 AM General Information Provided Yes Donnetta Hutching Call Closed By: Donnetta Hutching Transaction Date/Time: 11/03/2021 8:00:18 AM (ET   Name of Medication: tramadol 50 mg Name of Pharmacy: [facility] [address] Last Fill or Written Date and Quantity: # 60 on 09/15/2021 Last Office Visit and Type: 11/18/2020 medicare wellness Next Office Visit and Type: 12/01/21 CPX Last Controlled Substance Agreement Date: none seen Last [id number] seen

## 2021-11-03 NOTE — Telephone Encounter (Signed)
Pt notified that refill for tramadol was sent to [facility] [address] and pt voiced understanding and appreciative.

## 2021-11-12 ENCOUNTER — Telehealth: Payer: Self-pay | Admitting: Family Medicine

## 2021-11-12 DIAGNOSIS — E7800 Pure hypercholesterolemia, unspecified: Secondary | ICD-10-CM

## 2021-11-12 DIAGNOSIS — E559 Vitamin D deficiency, unspecified: Secondary | ICD-10-CM

## 2021-11-12 DIAGNOSIS — E038 Other specified hypothyroidism: Secondary | ICD-10-CM

## 2021-11-12 NOTE — Telephone Encounter (Signed)
-----   Message from Alvina Chou sent at 11/08/2021 11:36 AM EDT ----- Regarding: Lab orders for ,Friday, 9.1.23 Patient is scheduled for CPX labs, please order future labs, Thanks , Camelia Eng

## 2021-11-18 ENCOUNTER — Telehealth: Payer: Self-pay | Admitting: Family Medicine

## 2021-11-18 ENCOUNTER — Other Ambulatory Visit (INDEPENDENT_AMBULATORY_CARE_PROVIDER_SITE_OTHER): Payer: Medicare PPO

## 2021-11-18 DIAGNOSIS — E559 Vitamin D deficiency, unspecified: Secondary | ICD-10-CM

## 2021-11-18 DIAGNOSIS — E7800 Pure hypercholesterolemia, unspecified: Secondary | ICD-10-CM

## 2021-11-18 DIAGNOSIS — E038 Other specified hypothyroidism: Secondary | ICD-10-CM | POA: Diagnosis not present

## 2021-11-18 LAB — LIPID PANEL
Cholesterol: 200 mg/dL (ref 0–200)
HDL: 66.4 mg/dL (ref >39.00)
LDL Cholesterol: 103 mg/dL — ABNORMAL HIGH (ref 0–99)
NonHDL: 133.66
Total CHOL/HDL Ratio: 3
Triglycerides: 152 mg/dL — ABNORMAL HIGH (ref 0.0–149.0)
VLDL: 30.4 mg/dL (ref 0.0–40.0)

## 2021-11-18 LAB — T4, FREE: Free T4: 1.04 ng/dL (ref 0.60–1.60)

## 2021-11-18 LAB — COMPREHENSIVE METABOLIC PANEL
ALT: 14 U/L (ref 0–35)
AST: 16 U/L (ref 0–37)
Albumin: 3.9 g/dL (ref 3.5–5.2)
Alkaline Phosphatase: 59 U/L (ref 39–117)
BUN: 10 mg/dL (ref 6–23)
CO2: 31 mEq/L (ref 19–32)
Calcium: 9.3 mg/dL (ref 8.4–10.5)
Chloride: 103 mEq/L (ref 96–112)
Creatinine, Ser: 1 mg/dL (ref 0.40–1.20)
GFR: 55.47 mL/min — ABNORMAL LOW (ref >60.00)
Glucose, Bld: 97 mg/dL (ref 70–99)
Potassium: 4.2 mEq/L (ref 3.5–5.1)
Sodium: 142 mEq/L (ref 135–145)
Total Bilirubin: 0.6 mg/dL (ref 0.2–1.2)
Total Protein: 6.4 g/dL (ref 6.0–8.3)

## 2021-11-18 LAB — VITAMIN D 25 HYDROXY (VIT D DEFICIENCY, FRACTURES): VITD: 27.93 ng/mL — ABNORMAL LOW (ref 30.00–100.00)

## 2021-11-18 LAB — TSH: TSH: 0.17 u[IU]/mL — ABNORMAL LOW (ref 0.35–5.50)

## 2021-11-18 LAB — T3, FREE: T3, Free: 3 pg/mL (ref 2.3–4.2)

## 2021-11-18 NOTE — Progress Notes (Signed)
No critical labs need to be addressed urgently. We will discuss labs in detail at upcoming office visit.   

## 2021-11-18 NOTE — Telephone Encounter (Signed)
Left message for patient to call back and schedule Medicare Annual Wellness Visit (AWV).   Please offer to do virtually or by telephone.   Last AWV:: 11/18/2020  Please schedule at anytime with [facility]Nurse Health Advisor schedule   45 minute appointent  If any questions, please contact me at [phone]  

## 2021-11-24 ENCOUNTER — Encounter: Payer: Medicare Other | Admitting: Family Medicine

## 2021-11-24 ENCOUNTER — Other Ambulatory Visit: Payer: Medicare Other

## 2021-12-01 ENCOUNTER — Encounter: Payer: Self-pay | Admitting: Family Medicine

## 2021-12-01 ENCOUNTER — Ambulatory Visit (INDEPENDENT_AMBULATORY_CARE_PROVIDER_SITE_OTHER): Payer: Medicare PPO | Admitting: Family Medicine

## 2021-12-01 VITALS — BP 130/94 | HR 106 | Temp 99.2°F | Ht 63.75 in | Wt 162.5 lb

## 2021-12-01 DIAGNOSIS — E559 Vitamin D deficiency, unspecified: Secondary | ICD-10-CM

## 2021-12-01 DIAGNOSIS — E038 Other specified hypothyroidism: Secondary | ICD-10-CM

## 2021-12-01 DIAGNOSIS — Z23 Encounter for immunization: Secondary | ICD-10-CM

## 2021-12-01 DIAGNOSIS — M8580 Other specified disorders of bone density and structure, unspecified site: Secondary | ICD-10-CM

## 2021-12-01 DIAGNOSIS — Z0000 Encounter for general adult medical examination without abnormal findings: Secondary | ICD-10-CM

## 2021-12-01 DIAGNOSIS — I700 Atherosclerosis of aorta: Secondary | ICD-10-CM

## 2021-12-01 DIAGNOSIS — E7800 Pure hypercholesterolemia, unspecified: Secondary | ICD-10-CM

## 2021-12-01 NOTE — Assessment & Plan Note (Signed)
Chronic, worsening control.  No longer at LDL goal less than 70 despite atorvastatin milligrams daily.  She will get back on track with eating habits and start regular exercise.  We will reevaluate in 3 to 6 months.  We can always consider increasing atorvastatin if needed

## 2021-12-01 NOTE — Patient Instructions (Addendum)
Look at American heart association Website for low cholesterol diet. Start walking 2-3 times per week.  Continue atorvastatin 20 mg daily. Can return for cholesterol recheck in 3 months.. lab only.  Increase vit D intake to 1000 to 2000 IU daily.  Consider Shingrix vaccine.  Please call the location of your choice from the menu below to schedule your Mammogram and/or Bone Density appointment.     [facility]   Phone:  [phone]   [address]                                                                            [place], [place] [address]                                            Services: 3D Mammogram and Bone [facility] at [facility])  Phone:  [phone]   [address]                        [place], [place] [address]                                              Services:  3D Mammogram and Bone Density

## 2021-12-01 NOTE — Assessment & Plan Note (Signed)
Chronic, Well controlled on levothyroxine 137 mcg daily

## 2021-12-01 NOTE — Progress Notes (Signed)
Patient ID: Stephanie Kemp, female    DOB: 07-10-1945, 76 y.o.   MRN: [id number]  This visit was conducted in person.  BP (!) 130/94   Pulse (!) 106   Temp 99.2 F (37.3 C) (Oral)   Ht 5' 3.75" (1.619 m)   Wt 162 lb 8 oz (73.7 kg)   SpO2 97%   BMI 28.11 kg/m    CC:  Chief Complaint  Patient presents with   Medicare Wellness    Subjective:   HPI: Stephanie Kemp is a 76 y.o. female presenting on 12/01/2021 for Medicare Wellness  The patient presents for annual medicare wellness, complete physical and review of chronic health problems. He/She also has the following acute concerns today:  I have personally reviewed the Medicare Annual Wellness questionnaire and have noted 1. The patient's medical and social history 2. Their use of alcohol, tobacco or illicit drugs 3. Their current medications and supplements 4. The patient's functional ability including ADL's, fall risks, home safety risks and hearing or visual             impairment. 5. Diet and physical activities 6. Evidence for depression or mood disorders 7.         Updated provider list Cognitive evaluation was performed and recorded on pt medicare questionnaire form. The patients weight, height, BMI and visual acuity have been recorded in the chart   I have made referrals, counseling and provided education to the patient based review of the above and I have provided the pt with a written personalized care plan for preventive services. .  Documentation of this information was scanned into the electronic record under the media tab.   Advance directives and end of life planning reviewed in detail with patient and documented in EMR. Patient given handout on advance care directives if needed. HCPOA and living will updated if needed.  No falls in last 12 months.  [place] Office Visit from 12/01/2021 in [place] at [facility] Total Score 0      Hearing Screening  Method: Audiometry   500Hz   1000Hz  2000Hz  4000Hz   Right ear 40 25 40 40  Left ear 0 40 25 20   Vision Screening   Right eye Left eye Both eyes  Without correction 20/30 20/50 20/25   With correction       Aortic atherosclerosis: On statin.  Working on risk factor reduction    Elevated Cholesterol: Worsening control of LDL not at goal less than 70 despite atorvastatin 20 mg p.o. daily.  Lab Results  Component Value Date   CHOL 200 11/18/2021   HDL 66.40 11/18/2021   LDLCALC 103 (H) 11/18/2021   LDLDIRECT 145.3 10/11/2012   TRIG 152.0 (H) 11/18/2021   CHOLHDL 3 11/18/2021  Using medications without problems: Muscle aches:  Diet compliance: good... no new changes maybe more TV dinners. Exercise: none Other complaints:  Wt Readings from Last 3 Encounters:  12/01/21 162 lb 8 oz (73.7 kg)  11/18/20 163 lb 4 oz (74 kg)  09/17/20 161 lb 8 oz (73.3 kg)    Vitamin D deficiency: On supplement  Chronic low back pain on tramadol using 1 tablet daily 50 mg as needed.  Next  Hypothyroidism: free T3 and Free t4 nml.. no symptoms of hyperglycemia. Levo 137 mcg daily Lab Results  Component Value Date   TSH 0.17 (L) 11/18/2021    Relevant past medical, surgical, family and social history reviewed and updated as indicated. Interim  medical history since our last visit reviewed. Allergies and medications reviewed and updated. Outpatient Medications Prior to Visit  Medication Sig Dispense Refill   atorvastatin (LIPITOR) 20 MG tablet Take 1 tablet by mouth once daily 90 tablet 2   Calcium Citrate-Vitamin D (CALCIUM + D PO) Take 1 capsule by mouth 2 (two) times daily.     cholecalciferol (VITAMIN D3) 25 MCG (1000 UT) tablet Take 1,000 Units by mouth daily.     diphenhydramine-acetaminophen (TYLENOL PM) 25-500 MG TABS tablet Take 1 tablet by mouth at bedtime as needed.     levothyroxine (SYNTHROID) 137 MCG tablet Take 1 tablet by mouth once daily 90 tablet 3   Omega-3 Fatty Acids (FISH OIL PO) Take by mouth daily.      traMADol (ULTRAM) 50 MG tablet TAKE 1 TABLET BY MOUTH EVERY 12 HOURS AS NEEDED 60 tablet 0   No facility-administered medications prior to visit.     Per HPI unless specifically indicated in ROS section below Review of Systems  Constitutional:  Negative for fatigue and fever.  HENT:  Negative for congestion.   Eyes:  Negative for pain.  Respiratory:  Negative for cough and shortness of breath.   Cardiovascular:  Negative for chest pain, palpitations and leg swelling.  Gastrointestinal:  Negative for abdominal pain.  Genitourinary:  Negative for dysuria and vaginal bleeding.  Musculoskeletal:  Negative for back pain.  Neurological:  Negative for syncope, light-headedness and headaches.  Psychiatric/Behavioral:  Negative for dysphoric mood.    Objective:  BP (!) 130/94   Pulse (!) 106   Temp 99.2 F (37.3 C) (Oral)   Ht 5' 3.75" (1.619 m)   Wt 162 lb 8 oz (73.7 kg)   SpO2 97%   BMI 28.11 kg/m   Wt Readings from Last 3 Encounters:  12/01/21 162 lb 8 oz (73.7 kg)  11/18/20 163 lb 4 oz (74 kg)  09/17/20 161 lb 8 oz (73.3 kg)      Physical Exam Vitals and nursing note reviewed.  Constitutional:      General: She is not in acute distress.    Appearance: Normal appearance. She is well-developed. She is not ill-appearing or toxic-appearing.  HENT:     Head: Normocephalic.     Right Ear: Hearing, tympanic membrane, ear canal and external ear normal.     Left Ear: Hearing, tympanic membrane, ear canal and external ear normal.     Nose: Nose normal.  Eyes:     General: Lids are normal. Lids are everted, no foreign bodies appreciated.     Conjunctiva/sclera: Conjunctivae normal.     Pupils: Pupils are equal, round, and reactive to light.  Neck:     Thyroid: No thyroid mass or thyromegaly.     Vascular: No carotid bruit.     Trachea: Trachea normal.  Cardiovascular:     Rate and Rhythm: Normal rate and regular rhythm.     Heart sounds: Normal heart sounds, S1 normal and S2  normal. No murmur heard.    No gallop.  Pulmonary:     Effort: Pulmonary effort is normal. No respiratory distress.     Breath sounds: Normal breath sounds. No wheezing, rhonchi or rales.  Abdominal:     General: Bowel sounds are normal. There is no distension or abdominal bruit.     Palpations: Abdomen is soft. There is no fluid wave or mass.     Tenderness: There is no abdominal tenderness. There is no guarding or rebound.  Hernia: No hernia is present.  Musculoskeletal:     Cervical back: Normal range of motion and neck supple.  Lymphadenopathy:     Cervical: No cervical adenopathy.  Skin:    General: Skin is warm and dry.     Findings: No rash.  Neurological:     Mental Status: She is alert.     Cranial Nerves: No cranial nerve deficit.     Sensory: No sensory deficit.  Psychiatric:        Mood and Affect: Mood is not anxious or depressed.        Speech: Speech normal.        Behavior: Behavior normal. Behavior is cooperative.        Judgment: Judgment normal.       Results for orders placed or performed in visit on 11/18/21  T3, free  Result Value Ref Range   T3, Free 3.0 2.3 - 4.2 pg/mL  T4, free  Result Value Ref Range   Free T4 1.04 0.60 - 1.60 ng/dL  TSH  Result Value Ref Range   TSH 0.17 (L) 0.35 - 5.50 uIU/mL  VITAMIN D 25 Hydroxy (Vit-D Deficiency, Fractures)  Result Value Ref Range   VITD 27.93 (L) 30.00 - 100.00 ng/mL  Comprehensive metabolic panel  Result Value Ref Range   Sodium 142 135 - 145 mEq/L   Potassium 4.2 3.5 - 5.1 mEq/L   Chloride 103 96 - 112 mEq/L   CO2 31 19 - 32 mEq/L   Glucose, Bld 97 70 - 99 mg/dL   BUN 10 6 - 23 mg/dL   Creatinine, Ser 6.07 0.40 - 1.20 mg/dL   Total Bilirubin 0.6 0.2 - 1.2 mg/dL   Alkaline Phosphatase 59 39 - 117 U/L   AST 16 0 - 37 U/L   ALT 14 0 - 35 U/L   Total Protein 6.4 6.0 - 8.3 g/dL   Albumin 3.9 3.5 - 5.2 g/dL   GFR 37.10 (L) >62.69 mL/min   Calcium 9.3 8.4 - 10.5 mg/dL  Lipid panel  Result Value  Ref Range   Cholesterol 200 0 - 200 mg/dL   Triglycerides 485.4 (H) 0.0 - 149.0 mg/dL   HDL 62.70 >35.00 mg/dL   VLDL 93.8 0.0 - 18.2 mg/dL   LDL Cholesterol 993 (H) 0 - 99 mg/dL   Total CHOL/HDL Ratio 3    NonHDL 133.66      COVID 19 screen:  No recent travel or known exposure to COVID19 The patient denies respiratory symptoms of COVID 19 at this time. The importance of social distancing was discussed today.   Assessment and Plan The patient's preventative maintenance and recommended screening tests for an annual wellness exam were reviewed in full today. Brought up to date unless services declined.  Counselled on the importance of diet, exercise, and its role in overall health and mortality. The patient's FH and SH was reviewed, including their home life, tobacco status, and drug and alcohol status.   PAP/DVE:last pap 2013 nml, no longer indicated.  Asymptomatic. DVE: low risk, no symptoms and no family history of Uterine and ovarian  Hep C done  DEXA: 2017 osteopenia, repeat in 5 years.. due in 2022  mammo: 02/23/2021 Colon:  cologuard neg 2021 Vaccines: reviewed, COVID19 x 3 uptodate, uptodate Tdap, consider shingrix  Given flu shot today 12/01/2021   Problem List Items Addressed This Visit     Aortic atherosclerosis (HCC)    On statin no longer at goal LDL less than  70      High cholesterol    Chronic, worsening control.  No longer at LDL goal less than 70 despite atorvastatin milligrams daily.  She will get back on track with eating habits and start regular exercise.  We will reevaluate in 3 to 6 months.  We can always consider increasing atorvastatin if needed      Hypothyroidism    Chronic, Well controlled on levothyroxine 137 mcg daily      Osteopenia   Relevant Orders   DG Bone Density   Vitamin D deficiency    Chronic, not at goal.  Increase to 1000 to 2000 international units vitamin D daily.      Other Visit Diagnoses     Medicare annual wellness  visit, subsequent    -  Primary   Need for influenza vaccination       Relevant Orders   Flu Vaccine QUAD High Dose(Fluad) (Completed)         Kerby Nora, MD

## 2021-12-01 NOTE — Assessment & Plan Note (Signed)
Chronic, not at goal.  Increase to 1000 to 2000 international units vitamin D daily.

## 2021-12-01 NOTE — Assessment & Plan Note (Signed)
On statin no longer at goal LDL less than 70

## 2021-12-13 ENCOUNTER — Telehealth: Payer: Self-pay | Admitting: Family Medicine

## 2021-12-13 ENCOUNTER — Other Ambulatory Visit: Payer: Self-pay | Admitting: Family Medicine

## 2021-12-13 DIAGNOSIS — M545 Low back pain, unspecified: Secondary | ICD-10-CM

## 2021-12-13 NOTE — Telephone Encounter (Signed)
Bone Density Order faxed over to [facility].  Left message for Ms. Litz that order has been faxed and now she should be able to call them back to get that scheduled.

## 2021-12-13 NOTE — Telephone Encounter (Signed)
Pt stated during last visit with [facility], she was told to get a bone density scheduled. Pt was told she could not schedule appt without orders from her pcp & that a nurse would have to schedule appt. Call back # is [phone]

## 2021-12-14 NOTE — Telephone Encounter (Signed)
Last office visit 12/01/21 for [place].   Last refilled 11/03/21 for #60 with no refills.  CPE scheduled 12/06/2022.

## 2021-12-21 ENCOUNTER — Other Ambulatory Visit: Payer: Self-pay | Admitting: Family Medicine

## 2021-12-21 DIAGNOSIS — Z1231 Encounter for screening mammogram for malignant neoplasm of breast: Secondary | ICD-10-CM

## 2022-01-07 ENCOUNTER — Other Ambulatory Visit: Payer: Self-pay | Admitting: Family Medicine

## 2022-01-30 ENCOUNTER — Other Ambulatory Visit: Payer: Self-pay | Admitting: Family Medicine

## 2022-01-30 DIAGNOSIS — G8929 Other chronic pain: Secondary | ICD-10-CM

## 2022-01-31 NOTE — Telephone Encounter (Signed)
Last office visit 12/01/21 for [name].  Last refilled 12/14/21 for #60 with no refills.  CPE scheduled for 12/06/2022.

## 2022-02-24 ENCOUNTER — Other Ambulatory Visit (HOSPITAL_COMMUNITY): Payer: Medicare PPO

## 2022-02-27 ENCOUNTER — Ambulatory Visit
Admission: RE | Admit: 2022-02-27 | Discharge: 2022-02-27 | Disposition: A | Discharge status: Home / Self Care | Payer: Medicare PPO | Source: Ambulatory Visit | Attending: Family Medicine | Admitting: Family Medicine

## 2022-02-27 DIAGNOSIS — E559 Vitamin D deficiency, unspecified: Secondary | ICD-10-CM | POA: Diagnosis not present

## 2022-02-27 DIAGNOSIS — Z1231 Encounter for screening mammogram for malignant neoplasm of breast: Secondary | ICD-10-CM | POA: Diagnosis present

## 2022-02-27 DIAGNOSIS — Z780 Asymptomatic menopausal state: Secondary | ICD-10-CM | POA: Insufficient documentation

## 2022-02-27 DIAGNOSIS — E039 Hypothyroidism, unspecified: Secondary | ICD-10-CM | POA: Insufficient documentation

## 2022-02-27 DIAGNOSIS — M810 Age-related osteoporosis without current pathological fracture: Secondary | ICD-10-CM | POA: Diagnosis not present

## 2022-02-27 DIAGNOSIS — M8580 Other specified disorders of bone density and structure, unspecified site: Secondary | ICD-10-CM | POA: Insufficient documentation

## 2022-02-27 DIAGNOSIS — Z13820 Encounter for screening for osteoporosis: Secondary | ICD-10-CM | POA: Insufficient documentation

## 2022-02-28 ENCOUNTER — Encounter: Payer: Self-pay | Admitting: Family Medicine

## 2022-03-08 ENCOUNTER — Other Ambulatory Visit: Payer: Self-pay | Admitting: Family Medicine

## 2022-03-08 ENCOUNTER — Encounter: Payer: Self-pay | Admitting: Family Medicine

## 2022-03-08 ENCOUNTER — Ambulatory Visit: Payer: Medicare PPO | Admitting: Family Medicine

## 2022-03-08 VITALS — BP 120/78 | HR 83 | Temp 99.0°F | Ht 63.75 in | Wt 158.5 lb

## 2022-03-08 DIAGNOSIS — E559 Vitamin D deficiency, unspecified: Secondary | ICD-10-CM | POA: Diagnosis not present

## 2022-03-08 DIAGNOSIS — M810 Age-related osteoporosis without current pathological fracture: Secondary | ICD-10-CM

## 2022-03-08 DIAGNOSIS — M545 Other chronic pain: Secondary | ICD-10-CM

## 2022-03-08 LAB — VITAMIN D 25 HYDROXY (VIT D DEFICIENCY, FRACTURES): VITD: 36.1 ng/mL (ref 30.00–100.00)

## 2022-03-08 MED ORDER — ALENDRONATE SODIUM 70 MG PO TABS: 70.0000 mg | ORAL_TABLET | ORAL | 11 refills | Status: DC

## 2022-03-08 NOTE — Patient Instructions (Addendum)
Increase weight bearing exercise.Marland Kitchen 3-5 days a week.  Start alendronate weekly following administration recommendations.  Plan recheck DEXA in 2 years.

## 2022-03-08 NOTE — Progress Notes (Signed)
Patient ID: Stephanie JordanLinda C Slotnick, female    DOB: 1945/10/27, 76 y.o.   MRN: [id number]  This visit was conducted in person.  BP 120/78 (BP Location: Left Arm, Patient Position: Sitting)   Pulse 83   Temp 99 F (37.2 C) (Oral)   Ht 5' 3.75" (1.619 m)   Wt 158 lb 8 oz (71.9 kg)   SpO2 98%   BMI 27.42 kg/m    CC:  Chief Complaint  Patient presents with   Follow-up    Discuss osteoporosis treatment    Subjective:   HPI: Stephanie Kemp is a 76 y.o. female presenting on 03/08/2022 for Follow-up (Discuss osteoporosis treatment)   Recent DEXA showed:  The BMD measured at Forearm Radius 33% is 0.559 g/cm2 with a T-score of -3.6.    Prior DEXA 2019: T -1.9  She is on vit d and calcium.  Doing weight bearing exercise.  Reviewed labs from 11/2021  Recent Ca:9.3  Vit d: 7527... treated with 1000-2000 IU . will recheck. GFR> 30 ( at 57)   No history of gastritis/GERD? Bariatric surgery etc.   Relevant past medical, surgical, family and social history reviewed and updated as indicated. Interim medical history since our last visit reviewed. Allergies and medications reviewed and updated. Outpatient Medications Prior to Visit  Medication Sig Dispense Refill   atorvastatin (LIPITOR) 20 MG tablet Take 1 tablet by mouth once daily 90 tablet 3   Calcium Citrate-Vitamin D (CALCIUM + D PO) Take 1 capsule by mouth 2 (two) times daily.     cholecalciferol (VITAMIN D3) 25 MCG (1000 UT) tablet Take 1,000 Units by mouth daily.     diphenhydramine-acetaminophen (TYLENOL PM) 25-500 MG TABS tablet Take 1 tablet by mouth at bedtime as needed.     levothyroxine (SYNTHROID) 137 MCG tablet Take 1 tablet by mouth once daily 90 tablet 3   Omega-3 Fatty Acids (FISH OIL PO) Take by mouth daily.     traMADol (ULTRAM) 50 MG tablet TAKE 1 TABLET BY MOUTH EVERY 12 HOURS AS NEEDED 60 tablet 0   No facility-administered medications prior to visit.     Per HPI unless specifically indicated in ROS section  below Review of Systems  Constitutional:  Negative for fatigue and fever.  HENT:  Negative for congestion.   Eyes:  Negative for pain.  Respiratory:  Negative for cough and shortness of breath.   Cardiovascular:  Negative for chest pain, palpitations and leg swelling.  Gastrointestinal:  Negative for abdominal pain.  Genitourinary:  Negative for dysuria and vaginal bleeding.  Musculoskeletal:  Negative for back pain.  Neurological:  Negative for syncope, light-headedness and headaches.  Psychiatric/Behavioral:  Negative for dysphoric mood.    Objective:  BP 120/78 (BP Location: Left Arm, Patient Position: Sitting)   Pulse 83   Temp 99 F (37.2 C) (Oral)   Ht 5' 3.75" (1.619 m)   Wt 158 lb 8 oz (71.9 kg)   SpO2 98%   BMI 27.42 kg/m   Wt Readings from Last 3 Encounters:  03/08/22 158 lb 8 oz (71.9 kg)  12/01/21 162 lb 8 oz (73.7 kg)  11/18/20 163 lb 4 oz (74 kg)      Physical Exam Constitutional:      General: She is not in acute distress.    Appearance: Normal appearance. She is well-developed. She is not ill-appearing or toxic-appearing.  HENT:     Head: Normocephalic.     Right Ear: Hearing, tympanic membrane, ear canal  and external ear normal. Tympanic membrane is not erythematous, retracted or bulging.     Left Ear: Hearing, tympanic membrane, ear canal and external ear normal. Tympanic membrane is not erythematous, retracted or bulging.     Nose: No mucosal edema or rhinorrhea.     Right Sinus: No maxillary sinus tenderness or frontal sinus tenderness.     Left Sinus: No maxillary sinus tenderness or frontal sinus tenderness.     Mouth/Throat:     Pharynx: Uvula midline.  Eyes:     General: Lids are normal. Lids are everted, no foreign bodies appreciated.     Conjunctiva/sclera: Conjunctivae normal.     Pupils: Pupils are equal, round, and reactive to light.  Neck:     Thyroid: No thyroid mass or thyromegaly.     Vascular: No carotid bruit.     Trachea: Trachea  normal.  Cardiovascular:     Rate and Rhythm: Normal rate and regular rhythm.     Pulses: Normal pulses.     Heart sounds: Normal heart sounds, S1 normal and S2 normal. No murmur heard.    No friction rub. No gallop.  Pulmonary:     Effort: Pulmonary effort is normal. No tachypnea or respiratory distress.     Breath sounds: Normal breath sounds. No decreased breath sounds, wheezing, rhonchi or rales.  Abdominal:     General: Bowel sounds are normal.     Palpations: Abdomen is soft.     Tenderness: There is no abdominal tenderness.  Musculoskeletal:     Cervical back: Normal range of motion and neck supple.  Skin:    General: Skin is warm and dry.     Findings: No rash.  Neurological:     Mental Status: She is alert.  Psychiatric:        Mood and Affect: Mood is not anxious or depressed.        Speech: Speech normal.        Behavior: Behavior normal. Behavior is cooperative.        Thought Content: Thought content normal.        Judgment: Judgment normal.       Results for orders placed or performed in visit on 11/18/21  T3, free  Result Value Ref Range   T3, Free 3.0 2.3 - 4.2 pg/mL  T4, free  Result Value Ref Range   Free T4 1.04 0.60 - 1.60 ng/dL  TSH  Result Value Ref Range   TSH 0.17 (L) 0.35 - 5.50 uIU/mL  VITAMIN D 25 Hydroxy (Vit-D Deficiency, Fractures)  Result Value Ref Range   VITD 27.93 (L) 30.00 - 100.00 ng/mL  Comprehensive metabolic panel  Result Value Ref Range   Sodium 142 135 - 145 mEq/L   Potassium 4.2 3.5 - 5.1 mEq/L   Chloride 103 96 - 112 mEq/L   CO2 31 19 - 32 mEq/L   Glucose, Bld 97 70 - 99 mg/dL   BUN 10 6 - 23 mg/dL   Creatinine, Ser 1.61 0.40 - 1.20 mg/dL   Total Bilirubin 0.6 0.2 - 1.2 mg/dL   Alkaline Phosphatase 59 39 - 117 U/L   AST 16 0 - 37 U/L   ALT 14 0 - 35 U/L   Total Protein 6.4 6.0 - 8.3 g/dL   Albumin 3.9 3.5 - 5.2 g/dL   GFR 09.60 (L) >45.40 mL/min   Calcium 9.3 8.4 - 10.5 mg/dL  Lipid panel  Result Value Ref Range    Cholesterol 200 0 -  200 mg/dL   Triglycerides 152.0 (H) 0.0 - 149.0 mg/dL   HDL 66.40 >39.00 mg/dL   VLDL 30.4 0.0 - 40.0 mg/dL   LDL Cholesterol 103 (H) 0 - 99 mg/dL   Total CHOL/HDL Ratio 3    NonHDL 133.66      COVID 19 screen:  No recent travel or known exposure to COVID19 The patient denies respiratory symptoms of COVID 19 at this time. The importance of social distancing was discussed today.   Assessment and Plan   Age-related osteoporosis without current pathological fracture Assessment & Plan: New diagnosis  Increase weight bearing exercise.Marland Kitchen 3-5 days a week.  Start alendronate weekly following administration recommendations.  Plan recheck DEXA in 2 years.  Check Vit D.   Orders: -     VITAMIN D 25 Hydroxy (Vit-D Deficiency, Fractures); Future  Vitamin D deficiency -     VITAMIN D 25 Hydroxy (Vit-D Deficiency, Fractures); Future  Other orders -     Alendronate Sodium; Take 1 tablet (70 mg total) by mouth every 7 (seven) days. Take with a full glass of water on an empty stomach.  Dispense: 4 tablet; Refill: [place], MD

## 2022-03-09 NOTE — Telephone Encounter (Signed)
Last office visit 03/08/22 for osteoporosis.  Last refilled 01/31/22 for #60 with no refills.  CPE scheduled for 12/06/2022.

## 2022-04-13 NOTE — Assessment & Plan Note (Signed)
New diagnosis  Increase weight bearing exercise.Marland Kitchen 3-5 days a week.  Start alendronate weekly following administration recommendations.  Plan recheck DEXA in 2 years.  Check Vit D.

## 2022-04-17 ENCOUNTER — Other Ambulatory Visit: Payer: Self-pay | Admitting: Family Medicine

## 2022-04-17 DIAGNOSIS — G8929 Other chronic pain: Secondary | ICD-10-CM

## 2022-04-18 NOTE — Telephone Encounter (Signed)
Last office visit 03/08/22 for osteoporosis.  Last refilled 03/09/22 for #60 with no refills.  CPE scheduled 12/06/22.

## 2022-06-08 ENCOUNTER — Other Ambulatory Visit: Payer: Self-pay | Admitting: Family Medicine

## 2022-06-08 DIAGNOSIS — M545 Low back pain, unspecified: Secondary | ICD-10-CM

## 2022-06-08 NOTE — Telephone Encounter (Signed)
Last office visit 03/08/2022 for osteoporosis.  Last refilled 04/18/2022 for #60 with no refills.  Next appt: CPE 12/06/2022.

## 2022-08-05 ENCOUNTER — Other Ambulatory Visit: Payer: Self-pay | Admitting: Family Medicine

## 2022-08-05 DIAGNOSIS — G8929 Other chronic pain: Secondary | ICD-10-CM

## 2022-08-07 NOTE — Telephone Encounter (Signed)
Last office visit 03/08/2022 for osteoporosis.  Last refilled 06/08/2022 for #60 with no refills.  Next Appt: CPE 12/06/2022.

## 2022-08-24 ENCOUNTER — Telehealth: Payer: Self-pay

## 2022-08-24 NOTE — Telephone Encounter (Signed)
Received fax from [employer] requesting that we order a [employer] molecular diagnostics test for patient. I have called patient to see if that is something they requested. Left message for her to call office back. I am putting from in Dr. Ermalene Searing box until we hear from patient.

## 2022-08-25 NOTE — Telephone Encounter (Signed)
Left message to return call to our office.  

## 2022-08-25 NOTE — Telephone Encounter (Signed)
Patient returned call to the office, would like a call back whenever possible, please advise [phone]

## 2022-08-25 NOTE — Telephone Encounter (Signed)
Called patient she states she has no idea what was ordered and did not want to have ordered.

## 2022-11-02 ENCOUNTER — Encounter (INDEPENDENT_AMBULATORY_CARE_PROVIDER_SITE_OTHER): Payer: Self-pay

## 2022-11-15 ENCOUNTER — Telehealth: Payer: Self-pay | Admitting: *Deleted

## 2022-11-15 ENCOUNTER — Encounter: Payer: Self-pay | Admitting: *Deleted

## 2022-11-15 DIAGNOSIS — E559 Vitamin D deficiency, unspecified: Secondary | ICD-10-CM

## 2022-11-15 DIAGNOSIS — E038 Other specified hypothyroidism: Secondary | ICD-10-CM

## 2022-11-15 DIAGNOSIS — E7800 Pure hypercholesterolemia, unspecified: Secondary | ICD-10-CM

## 2022-11-15 NOTE — Telephone Encounter (Signed)
-----   Message from Alvina Chou sent at 11/14/2022 11:13 AM EDT ----- Regarding: Lab orders for Wednesday, 9.11.24 Patient is scheduled for CPX labs, please order future labs, Thanks , Camelia Eng

## 2022-11-15 NOTE — Telephone Encounter (Signed)
-----   Message from Lovena Neighbours sent at 11/14/2022  1:50 PM EDT ----- Regarding: Labs for 9.11.24 Please put physical lab orders in future. Thank you, Denny Peon

## 2022-11-15 NOTE — Telephone Encounter (Signed)
This encounter was created in error - please disregard.

## 2022-11-29 ENCOUNTER — Other Ambulatory Visit (INDEPENDENT_AMBULATORY_CARE_PROVIDER_SITE_OTHER): Payer: Medicare PPO

## 2022-11-29 DIAGNOSIS — E7800 Pure hypercholesterolemia, unspecified: Secondary | ICD-10-CM | POA: Diagnosis not present

## 2022-11-29 DIAGNOSIS — E038 Other specified hypothyroidism: Secondary | ICD-10-CM | POA: Diagnosis not present

## 2022-11-29 DIAGNOSIS — E559 Vitamin D deficiency, unspecified: Secondary | ICD-10-CM

## 2022-11-29 LAB — LIPID PANEL
Cholesterol: 201 mg/dL — ABNORMAL HIGH (ref 0–200)
HDL: 67.4 mg/dL (ref >39.00)
LDL Cholesterol: 109 mg/dL — ABNORMAL HIGH (ref 0–99)
NonHDL: 133.67
Total CHOL/HDL Ratio: 3
Triglycerides: 122 mg/dL (ref 0.0–149.0)
VLDL: 24.4 mg/dL (ref 0.0–40.0)

## 2022-11-29 LAB — COMPREHENSIVE METABOLIC PANEL
ALT: 20 U/L (ref 0–35)
AST: 20 U/L (ref 0–37)
Albumin: 3.6 g/dL (ref 3.5–5.2)
Alkaline Phosphatase: 54 U/L (ref 39–117)
BUN: 17 mg/dL (ref 6–23)
CO2: 30 meq/L (ref 19–32)
Calcium: 8.8 mg/dL (ref 8.4–10.5)
Chloride: 104 meq/L (ref 96–112)
Creatinine, Ser: 1.1 mg/dL (ref 0.40–1.20)
GFR: 49.12 mL/min — ABNORMAL LOW (ref >60.00)
Glucose, Bld: 87 mg/dL (ref 70–99)
Potassium: 4 meq/L (ref 3.5–5.1)
Sodium: 140 meq/L (ref 135–145)
Total Bilirubin: 0.6 mg/dL (ref 0.2–1.2)
Total Protein: 5.9 g/dL — ABNORMAL LOW (ref 6.0–8.3)

## 2022-11-29 LAB — TSH: TSH: 2.01 u[IU]/mL (ref 0.35–5.50)

## 2022-11-29 LAB — VITAMIN D 25 HYDROXY (VIT D DEFICIENCY, FRACTURES): VITD: 27.57 ng/mL — ABNORMAL LOW (ref 30.00–100.00)

## 2022-11-29 LAB — T4, FREE: Free T4: 1.13 ng/dL (ref 0.60–1.60)

## 2022-11-29 LAB — T3, FREE: T3, Free: 2.7 pg/mL (ref 2.3–4.2)

## 2022-12-01 ENCOUNTER — Telehealth: Payer: Self-pay | Admitting: Family Medicine

## 2022-12-01 NOTE — Telephone Encounter (Signed)
Patient has her CPE on Wednesday 12/06/2022 with Dr. Ermalene Searing.  Will give copy of labs to her at that appointment.

## 2022-12-01 NOTE — Telephone Encounter (Signed)
Patient called in and would like a copy of her lab results to be mailed out to her. Thank you!

## 2022-12-06 ENCOUNTER — Encounter: Payer: Medicare PPO | Admitting: Family Medicine

## 2022-12-13 ENCOUNTER — Other Ambulatory Visit: Payer: Self-pay | Admitting: Family Medicine

## 2022-12-13 DIAGNOSIS — M545 Low back pain, unspecified: Secondary | ICD-10-CM

## 2022-12-14 NOTE — Telephone Encounter (Signed)
LOV- 03/08/22 (osteoporosis)  Last refill- 08/08/22 #60 w/ 0 refiils  NOV- n/a

## 2022-12-28 ENCOUNTER — Other Ambulatory Visit: Payer: Self-pay | Admitting: Family Medicine

## 2023-01-11 ENCOUNTER — Encounter: Payer: Self-pay | Admitting: Family Medicine

## 2023-01-11 ENCOUNTER — Ambulatory Visit: Payer: Medicare PPO | Admitting: Family Medicine

## 2023-01-11 VITALS — BP 110/80 | HR 92 | Temp 98.9°F | Ht 64.75 in | Wt 163.4 lb

## 2023-01-11 DIAGNOSIS — Z0000 Encounter for general adult medical examination without abnormal findings: Secondary | ICD-10-CM

## 2023-01-11 DIAGNOSIS — E7800 Pure hypercholesterolemia, unspecified: Secondary | ICD-10-CM

## 2023-01-11 DIAGNOSIS — E038 Other specified hypothyroidism: Secondary | ICD-10-CM | POA: Diagnosis not present

## 2023-01-11 DIAGNOSIS — K449 Diaphragmatic hernia without obstruction or gangrene: Secondary | ICD-10-CM

## 2023-01-11 DIAGNOSIS — Z23 Encounter for immunization: Secondary | ICD-10-CM | POA: Diagnosis not present

## 2023-01-11 DIAGNOSIS — M810 Age-related osteoporosis without current pathological fracture: Secondary | ICD-10-CM | POA: Diagnosis not present

## 2023-01-11 MED ORDER — ALENDRONATE SODIUM 70 MG PO TABS: 70.0000 mg | ORAL_TABLET | ORAL | 11 refills | Status: AC

## 2023-01-11 MED ORDER — ATORVASTATIN CALCIUM 40 MG PO TABS: 40.0000 mg | ORAL_TABLET | Freq: Every day | ORAL | 3 refills | Status: DC

## 2023-01-11 NOTE — Assessment & Plan Note (Addendum)
Chronic, inadequate control despite  atorvastatin 20 mg daily .  Will increase atorvastatin  to 40 mg daily and re-evla in 3 months.

## 2023-01-11 NOTE — Patient Instructions (Addendum)
Can use Pepcid AC at bedtime if needed for hiatal hernia symptoms. Can increase vit D to 2000 IU a day. Increase atoravastatin to 40 mg daily.  Restart fosamax  if GI issue are improved.

## 2023-01-11 NOTE — Progress Notes (Signed)
Patient ID: Stephanie Kemp, female    DOB: [birth year], 77 y.o.   MRN: [id number]  This visit was conducted in person.  BP 110/80 (BP Location: Left Arm, Patient Position: Sitting, Cuff Size: Large)   Pulse 92   Temp 98.9 F (37.2 C) (Temporal)   Ht 5' 4.75" (1.645 m)   Wt 163 lb 6 oz (74.1 kg)   SpO2 98%   BMI 27.40 kg/m    CC:  Chief Complaint  Patient presents with   Medicare Wellness    Subjective:   HPI: Stephanie Kemp is a 77 y.o. female presenting on 01/11/2023 for Medicare Wellness  The patient presents for annual medicare wellness, complete physical and review of chronic health problems. He/She also has the following acute concerns today:  fatigue, not as active as she used to  She does have moderate size hiatal hernia.  I have personally reviewed the Medicare Annual Wellness questionnaire and have noted 1. The patient's medical and social history 2. Their use of alcohol, tobacco or illicit drugs 3. Their current medications and supplements 4. The patient's functional ability including ADL's, fall risks, home safety risks and hearing or visual    impairment. 5. Diet and physical activities 6. Evidence for depression or mood disorders 7.         Updated provider list Cognitive evaluation was performed and recorded on pt medicare questionnaire form. The patients weight, height, BMI and visual acuity have been recorded in the chart   I have made referrals, counseling and provided education to the patient based review of the above and I have provided the pt with a written personalized care plan for preventive services. .  Documentation of this information was scanned into the electronic record under the media tab.   Advance directives and end of life planning reviewed in detail with patient and documented in EMR. Patient given handout on advance care directives if needed. HCPOA and living will updated if needed.  No falls in last 12 months.  Flowsheet Row Office  Visit from 01/11/2023 in [facility] [place]  PHQ-2 Total Score 0      Hearing Screening  Method: Audiometry   500Hz  1000Hz  2000Hz  4000Hz   Right ear 20 20 20 20   Left ear 20 20 20 20    Vision Screening   Right eye Left eye Both eyes  Without correction 20/25 20/25 20/25   With correction       Aortic atherosclerosis: On statin.  Working on risk factor reduction    Elevated Cholesterol: Worsening control of LDL not at goal less than 70 despite atorvastatin 20 mg p.o. daily. NO SE to this medication. The 10-year ASCVD risk score (Arnett DK, et al., 2019) is: 12.2%   Values used to calculate the score:     Age: 39 years     Sex: Female     Is Non-Hispanic African American: No     Diabetic: No     Tobacco smoker: No     Systolic Blood Pressure: 110 mmHg     Is BP treated: No     HDL Cholesterol: 67.4 mg/dL     Total Cholesterol: 201 mg/dL   Lab Results  Component Value Date   CHOL 201 (H) 11/29/2022   HDL 67.40 11/29/2022   LDLCALC 109 (H) 11/29/2022   LDLDIRECT 145.3 10/11/2012   TRIG 122.0 11/29/2022   CHOLHDL 3 11/29/2022  Using medications without problems: Muscle aches:  Diet compliance: good.Marland KitchenMarland Kitchen  no new changes maybe more TV dinners. Exercise: none Other complaints:  Wt Readings from Last 3 Encounters:  01/11/23 163 lb 6 oz (74.1 kg)  03/08/22 158 lb 8 oz (71.9 kg)  12/01/21 162 lb 8 oz (73.7 kg)   Vitamin D deficiency: On supplement  Chronic low back pain on tramadol using 1 tablet daily 50 mg as needed.  Next  Hypothyroidism: free T3 and Free t4 nml.. no symptoms of hyperglycemia. Levo 137 mcg daily Lab Results  Component Value Date   TSH 2.01 11/29/2022    Relevant past medical, surgical, family and social history reviewed and updated as indicated. Interim medical history since our last visit reviewed. Allergies and medications reviewed and updated. Outpatient Medications Prior to Visit  Medication Sig Dispense Refill    Calcium Citrate-Vitamin D (CALCIUM + D PO) Take 1 capsule by mouth 2 (two) times daily.     cholecalciferol (VITAMIN D3) 25 MCG (1000 UT) tablet Take 1,000 Units by mouth daily.     diphenhydramine-acetaminophen (TYLENOL PM) 25-500 MG TABS tablet Take 1 tablet by mouth at bedtime as needed.     levothyroxine (SYNTHROID) 137 MCG tablet Take 1 tablet by mouth once daily 90 tablet 1   Omega-3 Fatty Acids (FISH OIL PO) Take by mouth daily.     traMADol (ULTRAM) 50 MG tablet Take 1 tablet (50 mg total) by mouth every 12 (twelve) hours as needed. 60 tablet 0   atorvastatin (LIPITOR) 20 MG tablet Take 1 tablet by mouth once daily 90 tablet 1   alendronate (FOSAMAX) 70 MG tablet Take 1 tablet (70 mg total) by mouth every 7 (seven) days. Take with a full glass of water on an empty stomach. (Patient not taking: Reported on 01/11/2023) 4 tablet 11   No facility-administered medications prior to visit.     Per HPI unless specifically indicated in ROS section below Review of Systems  Constitutional:  Negative for fatigue and fever.  HENT:  Negative for congestion.   Eyes:  Negative for pain.  Respiratory:  Negative for cough and shortness of breath.   Cardiovascular:  Negative for chest pain, palpitations and leg swelling.  Gastrointestinal:  Negative for abdominal pain.  Genitourinary:  Negative for dysuria and vaginal bleeding.  Musculoskeletal:  Negative for back pain.  Neurological:  Negative for syncope, light-headedness and headaches.  Psychiatric/Behavioral:  Negative for dysphoric mood.    Objective:  BP 110/80 (BP Location: Left Arm, Patient Position: Sitting, Cuff Size: Large)   Pulse 92   Temp 98.9 F (37.2 C) (Temporal)   Ht 5' 4.75" (1.645 m)   Wt 163 lb 6 oz (74.1 kg)   SpO2 98%   BMI 27.40 kg/m   Wt Readings from Last 3 Encounters:  01/11/23 163 lb 6 oz (74.1 kg)  03/08/22 158 lb 8 oz (71.9 kg)  12/01/21 162 lb 8 oz (73.7 kg)      Physical Exam Vitals and nursing note  reviewed.  Constitutional:      General: She is not in acute distress.    Appearance: Normal appearance. She is well-developed. She is not ill-appearing or toxic-appearing.  HENT:     Head: Normocephalic.     Right Ear: Hearing, tympanic membrane, ear canal and external ear normal.     Left Ear: Hearing, tympanic membrane, ear canal and external ear normal.     Nose: Nose normal.  Eyes:     General: Lids are normal. Lids are everted, no foreign bodies appreciated.  Conjunctiva/sclera: Conjunctivae normal.     Pupils: Pupils are equal, round, and reactive to light.  Neck:     Thyroid: No thyroid mass or thyromegaly.     Vascular: No carotid bruit.     Trachea: Trachea normal.  Cardiovascular:     Rate and Rhythm: Normal rate and regular rhythm.     Heart sounds: Normal heart sounds, S1 normal and S2 normal. No murmur heard.    No gallop.  Pulmonary:     Effort: Pulmonary effort is normal. No respiratory distress.     Breath sounds: Normal breath sounds. No wheezing, rhonchi or rales.  Abdominal:     General: Bowel sounds are normal. There is no distension or abdominal bruit.     Palpations: Abdomen is soft. There is no fluid wave or mass.     Tenderness: There is no abdominal tenderness. There is no guarding or rebound.     Hernia: No hernia is present.  Musculoskeletal:     Cervical back: Normal range of motion and neck supple.  Lymphadenopathy:     Cervical: No cervical adenopathy.  Skin:    General: Skin is warm and dry.     Findings: No rash.  Neurological:     Mental Status: She is alert.     Cranial Nerves: No cranial nerve deficit.     Sensory: No sensory deficit.  Psychiatric:        Mood and Affect: Mood is not anxious or depressed.        Speech: Speech normal.        Behavior: Behavior normal. Behavior is cooperative.        Judgment: Judgment normal.       Results for orders placed or performed in visit on 11/29/22  VITAMIN D 25 Hydroxy (Vit-D  Deficiency, Fractures)  Result Value Ref Range   VITD 27.57 (L) 30.00 - 100.00 ng/mL  T3, free  Result Value Ref Range   T3, Free 2.7 2.3 - 4.2 pg/mL  T4, free  Result Value Ref Range   Free T4 1.13 0.60 - 1.60 ng/dL  TSH  Result Value Ref Range   TSH 2.01 0.35 - 5.50 uIU/mL  Comprehensive metabolic panel  Result Value Ref Range   Sodium 140 135 - 145 mEq/L   Potassium 4.0 3.5 - 5.1 mEq/L   Chloride 104 96 - 112 mEq/L   CO2 30 19 - 32 mEq/L   Glucose, Bld 87 70 - 99 mg/dL   BUN 17 6 - 23 mg/dL   Creatinine, Ser 4.09 0.40 - 1.20 mg/dL   Total Bilirubin 0.6 0.2 - 1.2 mg/dL   Alkaline Phosphatase 54 39 - 117 U/L   AST 20 0 - 37 U/L   ALT 20 0 - 35 U/L   Total Protein 5.9 (L) 6.0 - 8.3 g/dL   Albumin 3.6 3.5 - 5.2 g/dL   GFR 81.19 (L) >14.78 mL/min   Calcium 8.8 8.4 - 10.5 mg/dL  Lipid panel  Result Value Ref Range   Cholesterol 201 (H) 0 - 200 mg/dL   Triglycerides 295.6 0.0 - 149.0 mg/dL   HDL 21.30 >86.57 mg/dL   VLDL 84.6 0.0 - 96.2 mg/dL   LDL Cholesterol 952 (H) 0 - 99 mg/dL   Total CHOL/HDL Ratio 3    NonHDL 133.67      COVID 19 screen:  No recent travel or known exposure to COVID19 The patient denies respiratory symptoms of COVID 19 at this time. The importance  of social distancing was discussed today.   Assessment and Plan The patient's preventative maintenance and recommended screening tests for an annual wellness exam were reviewed in full today. Brought up to date unless services declined.  Counselled on the importance of diet, exercise, and its role in overall health and mortality. The patient's FH and SH was reviewed, including their home life, tobacco status, and drug and alcohol status.   PAP/DVE:last pap 2013 nml, no longer indicated.  Asymptomatic. DVE: low risk, no symptoms and no family history of Uterine and ovarian  Hep C done  DEXA: 2017 osteopenia,  02/2022 osteoporosis. Not sure why not taking fosamax.. restart  fosamax. mammo:  02/2022. Colon:  cologuard neg 2021 Vaccines: reviewed, COVID19 x 3 uptodate, uptodate Tdap, consider shingrix  Given flu shot today 10/24/204   Problem List Items Addressed This Visit     Hiatal hernia    Chronic, most likely causing some of her nighttime emesis and chest pressure.  Recommend starting Pepcid AC at bedtime, encouraged smaller meals and elevation of bed.  If symptoms are not improving as expected she will return for further evaluation.      High cholesterol    Chronic, inadequate control despite  atorvastatin 20 mg daily .  Will increase atorvastatin  to 40 mg daily and re-evla in 3 months.      Relevant Medications   atorvastatin (LIPITOR) 40 MG tablet   Hypothyroidism    Chronic, Well controlled on levothyroxine 137 mcg daily      Osteoporosis     Not sure why she has not been taking this.  She thought she was on this was to take it for 12 weeks.  Per patient's reports she had no side effects to it in the past.  Restart fosamax if GI issue resolved with Pepcid Ac. Plan reevaluation of bone density in 2025.      Relevant Medications   alendronate (FOSAMAX) 70 MG tablet   Other Visit Diagnoses     Medicare annual wellness visit, subsequent    -  Primary   Encounter for immunization       Relevant Orders   Flu Vaccine Trivalent High Dose (Fluad) (Completed)         Kerby Nora, MD

## 2023-01-11 NOTE — Assessment & Plan Note (Signed)
Chronic, Well controlled on levothyroxine 137 mcg daily

## 2023-01-11 NOTE — Assessment & Plan Note (Addendum)
Not sure why she has not been taking this.  She thought she was on this was to take it for 12 weeks.  Per patient's reports she had no side effects to it in the past.  Restart fosamax if GI issue resolved with Pepcid Ac. Plan reevaluation of bone density in 2025.

## 2023-01-11 NOTE — Assessment & Plan Note (Signed)
Chronic, most likely causing some of her nighttime emesis and chest pressure.  Recommend starting Pepcid AC at bedtime, encouraged smaller meals and elevation of bed.  If symptoms are not improving as expected she will return for further evaluation.

## 2023-02-07 ENCOUNTER — Other Ambulatory Visit: Payer: Self-pay | Admitting: Family Medicine

## 2023-02-07 DIAGNOSIS — Z1231 Encounter for screening mammogram for malignant neoplasm of breast: Secondary | ICD-10-CM

## 2023-02-07 DIAGNOSIS — M545 Low back pain, unspecified: Secondary | ICD-10-CM

## 2023-02-07 NOTE — Telephone Encounter (Signed)
Last office visit 01/11/2023 for [name].  Last refilled 12/14/2022 for #60 with no refills.  Next Appt: No future appointments with PCP.

## 2023-04-03 ENCOUNTER — Telehealth: Payer: Self-pay | Admitting: *Deleted

## 2023-04-03 DIAGNOSIS — E7800 Pure hypercholesterolemia, unspecified: Secondary | ICD-10-CM

## 2023-04-03 NOTE — Telephone Encounter (Signed)
-----   Message from Lovena Neighbours sent at 04/03/2023  1:47 PM EST ----- Regarding: Labs for Friday 1.24.25 Please put lab orders in future. Thank you, Denny Peon

## 2023-04-13 ENCOUNTER — Encounter: Payer: Self-pay | Admitting: Family Medicine

## 2023-04-13 ENCOUNTER — Other Ambulatory Visit (INDEPENDENT_AMBULATORY_CARE_PROVIDER_SITE_OTHER): Payer: Medicare PPO

## 2023-04-13 DIAGNOSIS — E7800 Pure hypercholesterolemia, unspecified: Secondary | ICD-10-CM

## 2023-04-13 LAB — LIPID PANEL
Cholesterol: 225 mg/dL — ABNORMAL HIGH (ref 0–200)
HDL: 60.9 mg/dL (ref >39.00)
LDL Cholesterol: 133 mg/dL — ABNORMAL HIGH (ref 0–99)
NonHDL: 164.41
Total CHOL/HDL Ratio: 4
Triglycerides: 159 mg/dL — ABNORMAL HIGH (ref 0.0–149.0)
VLDL: 31.8 mg/dL (ref 0.0–40.0)

## 2023-04-13 LAB — COMPREHENSIVE METABOLIC PANEL
ALT: 16 U/L (ref 0–35)
AST: 20 U/L (ref 0–37)
Albumin: 4 g/dL (ref 3.5–5.2)
Alkaline Phosphatase: 59 U/L (ref 39–117)
BUN: 13 mg/dL (ref 6–23)
CO2: 26 meq/L (ref 19–32)
Calcium: 9.1 mg/dL (ref 8.4–10.5)
Chloride: 103 meq/L (ref 96–112)
Creatinine, Ser: 1.01 mg/dL (ref 0.40–1.20)
GFR: 54.28 mL/min — ABNORMAL LOW (ref >60.00)
Glucose, Bld: 116 mg/dL — ABNORMAL HIGH (ref 70–99)
Potassium: 3.8 meq/L (ref 3.5–5.1)
Sodium: 138 meq/L (ref 135–145)
Total Bilirubin: 0.6 mg/dL (ref 0.2–1.2)
Total Protein: 6.7 g/dL (ref 6.0–8.3)

## 2023-04-23 ENCOUNTER — Other Ambulatory Visit: Payer: Self-pay | Admitting: Family Medicine

## 2023-04-23 DIAGNOSIS — M545 Low back pain, unspecified: Secondary | ICD-10-CM

## 2023-04-23 NOTE — Telephone Encounter (Signed)
Last office visit 01/11/2023 for [name].  Last refilled 02/08/2023 for #60 with no refills.  Next Appt: No future appointments.

## 2023-04-23 NOTE — Telephone Encounter (Signed)
Copied from CRM [id number]. Topic: Clinical - Medication Refill >> Apr 23, 2023  2:11 PM Kathryne Eriksson wrote: Most Recent Primary Care Visit:  Provider: [facility]  Department: [facility]  Visit Type: LAB  Date: 04/13/2023  Medication: traMADol (ULTRAM) 50 MG tablet  Has the patient contacted their pharmacy? Yes (Agent: If no, request that the patient contact the pharmacy for the refill. If patient does not wish to contact the pharmacy document the reason why and proceed with request.) (Agent: If yes, when and what did the pharmacy advise?)  Is this the correct pharmacy for this prescription? Yes If no, delete pharmacy and type the correct one.  This is the patient's preferred pharmacy:  [facility] [address], [place] - [address] [place] [address] Phone: [phone] Fax: [phone]     Has the prescription been filled recently? No  Is the patient out of the medication? No  Has the patient been seen for an appointment in the last year OR does the patient have an upcoming appointment? Yes  Can we respond through MyChart? Yes  Agent: Please be advised that Rx refills may take up to 3 business days. We ask that you follow-up with your pharmacy.

## 2023-04-24 MED ORDER — TRAMADOL HCL 50 MG PO TABS: 50.0000 mg | ORAL_TABLET | Freq: Two times a day (BID) | ORAL | 0 refills | Status: DC | PRN

## 2023-04-26 ENCOUNTER — Ambulatory Visit
Admission: RE | Admit: 2023-04-26 | Discharge: 2023-04-26 | Disposition: A | Discharge status: Home / Self Care | Payer: Medicare PPO | Source: Ambulatory Visit | Attending: Family Medicine | Admitting: Family Medicine

## 2023-04-26 DIAGNOSIS — Z1231 Encounter for screening mammogram for malignant neoplasm of breast: Secondary | ICD-10-CM | POA: Diagnosis not present

## 2023-06-15 ENCOUNTER — Ambulatory Visit: Payer: Self-pay

## 2023-06-15 NOTE — Telephone Encounter (Signed)
 Noted.

## 2023-06-15 NOTE — Telephone Encounter (Signed)
 Copied from CRM [id number]. Topic: Clinical - Red Word Triage >> Jun 15, 2023 12:43 PM Desma Mcgregor wrote: Red Word that prompted transfer to Nurse Triage: Painful, frequent and burning when urinating. Been since yesterday and also has chills. Looking to get a medication prescribed.  Chief Complaint: frequent painful urination Symptoms: started yesterday Frequency: constant Pertinent Negatives: Patient denies fever, flank pain Disposition: [] ED /[x] Urgent Care (no appt availability in office) / [] Appointment(In office/virtual)/ []  [facility] [facility]/ [] Home Care/ [] Refused Recommended Disposition /[] [facility]/ []  Follow-up with PCP Additional Notes: per protocol patient sent to UC due to no apts available;  care advice given, denies questions; instructed to go to ER if becomes worse.   Reason for Disposition  Age > 50 years  Answer Assessment - Initial Assessment Questions 1. SEVERITY: "How bad is the pain?"  (e.g., Scale 1-10; mild, moderate, or severe)   - MILD (1-3): complains slightly about urination hurting   - MODERATE (4-7): interferes with normal activities     - SEVERE (8-10): excruciating, unwilling or unable to urinate because of the pain      8/10 2. FREQUENCY: "How many times have you had painful urination today?"      Every 10 or 15 min and only passing a few drops 3. PATTERN: "Is pain present every time you urinate or just sometimes?"      everytime 4. ONSET: "When did the painful urination start?"      yesterday 5. FEVER: "Do you have a fever?" If Yes, ask: "What is your temperature, how was it measured, and when did it start?"     chills 6. PAST UTI: "Have you had a urine infection before?" If Yes, ask: "When was the last time?" and "What happened that time?"      yes 7. CAUSE: "What do you think is causing the painful urination?"  (e.g., UTI, scratch, Herpes sore)     uti 8. OTHER SYMPTOMS: "Do you have any other symptoms?" (e.g., blood in urine, flank  pain, genital sores, urgency, vaginal discharge)     urgency 9. PREGNANCY: "Is there any chance you are pregnant?" "When was your last menstrual period?"     na  Protocols used: Urination Pain - Female-A-AH

## 2023-06-18 ENCOUNTER — Ambulatory Visit: Admitting: Family Medicine

## 2023-06-18 ENCOUNTER — Encounter: Payer: Self-pay | Admitting: Family Medicine

## 2023-06-18 VITALS — BP 121/80 | HR 78 | Temp 99.0°F | Ht 64.75 in | Wt 161.0 lb

## 2023-06-18 DIAGNOSIS — N390 Urinary tract infection, site not specified: Secondary | ICD-10-CM | POA: Insufficient documentation

## 2023-06-18 DIAGNOSIS — N3000 Acute cystitis without hematuria: Secondary | ICD-10-CM | POA: Diagnosis not present

## 2023-06-18 DIAGNOSIS — R300 Dysuria: Secondary | ICD-10-CM | POA: Diagnosis not present

## 2023-06-18 LAB — POC URINALSYSI DIPSTICK (AUTOMATED)
Bilirubin, UA: NEGATIVE
Blood, UA: NEGATIVE
Glucose, UA: NEGATIVE
Ketones, UA: NEGATIVE
Nitrite, UA: NEGATIVE
Protein, UA: POSITIVE — AB
Spec Grav, UA: 1.015 (ref 1.010–1.025)
Urobilinogen, UA: 0.2 U/dL
pH, UA: 6 (ref 5.0–8.0)

## 2023-06-18 MED ORDER — CEPHALEXIN 500 MG PO CAPS: 500.0000 mg | ORAL_CAPSULE | Freq: Two times a day (BID) | ORAL | 0 refills | Status: AC

## 2023-06-18 NOTE — Patient Instructions (Signed)
 Keep drinking lots of water Take keflex as directed   If symptoms worsen before culture returns -please call   We will reach out with culture result

## 2023-06-18 NOTE — Assessment & Plan Note (Signed)
 Several days Uncomplicated  Positive urinalysis and voiding symptoms   Prescription keflex 500 mg bid  Culture pending  Instructed to call if symptoms worsen in meantime  Call back and Er precautions noted in detail today   Handout give

## 2023-06-18 NOTE — Progress Notes (Signed)
 Subjective:    Patient ID: Stephanie Kemp, female    DOB: 04-16-1945, 78 y.o.   MRN: [id number]  HPI  Wt Readings from Last 3 Encounters:  06/18/23 161 lb (73 kg)  01/11/23 163 lb 6 oz (74.1 kg)  03/08/22 158 lb 8 oz (71.9 kg)   27.00 kg/m  Vitals:   06/18/23 0932 06/18/23 0953  BP: (!) 140/90 121/80  Pulse: 78   Temp: 99 F (37.2 C)   SpO2: 97%      78 yo pt of Dr Ermalene Searing presents with urinary symptoms    Urine looked cloudy/milky Friday  Less urine volume Saturday / then she pushed fluids and drank cranberry  Frequency and urgency  Feels like she really needs to go then just a dribble   Feels pressure in upper thighs / discomfort with urinate  Bladder pressure and discomfort   Is improved today   Thinks she had a fever /felt like it  Some nausea   Prone to vomiting any way-hard to say / is a GI issue   No flank pain   Results for orders placed or performed in visit on 06/18/23  POCT Urinalysis Dipstick (Automated)   Collection Time: 06/18/23  9:47 AM  Result Value Ref Range   Color, UA Yellow    Clarity, UA Hazy    Glucose, UA Negative Negative   Bilirubin, UA Negative    Ketones, UA Negative    Spec Grav, UA 1.015 1.010 - 1.025   Blood, UA Negative    pH, UA 6.0 5.0 - 8.0   Protein, UA Positive (A) Negative   Urobilinogen, UA 0.2 0.2 or 1.0 E.U./dL   Nitrite, UA Negative    Leukocytes, UA Moderate (2+) (A) Negative       Patient Active Problem List   Diagnosis Date Noted   UTI (urinary tract infection) 06/18/2023   Hiatal hernia 01/11/2023   Aortic atherosclerosis (HCC) 09/17/2018   Mixed incontinence 05/05/2016   Advanced directives, counseling/discussion 03/30/2015   Chronic low back pain 03/30/2015   Vitamin D deficiency 02/18/2014   High cholesterol 07/11/2010   Hypothyroidism 04/23/2009   OSTEOARTHRITIS, MODERATE 04/23/2009   Osteoporosis 04/23/2009   Past Medical History:  Diagnosis Date   Arthritis    lower back, left hip    Degenerative disc disease, lumbar    Headache    migraines.  none in several yrs   Hypothyroidism    Pericardial effusion    approx 2010. Unknown etiology. resolved.   Refusal of blood transfusions as patient is Jehovah's Witness    S/P dilatation and curettage    Past Surgical History:  Procedure Laterality Date   BROW LIFT Bilateral 11/23/2015   Procedure: BLEPHAROPLASTY, upper;  Surgeon: Imagene Riches, MD;  Location: [facility];  Service: Ophthalmology;  Laterality: Bilateral;   DENTAL SURGERY  04/2018   DILATION AND CURETTAGE OF UTERUS     ECTROPION REPAIR Bilateral 11/23/2015   Procedure: REPAIR OF ECTROPION, lower;  Surgeon: Imagene Riches, MD;  Location: [facility];  Service: Ophthalmology;  Laterality: Bilateral;   PTOSIS REPAIR Bilateral 11/23/2015   Procedure: PTOSIS REPAIR, upper;  Surgeon: Imagene Riches, MD;  Location: [facility];  Service: Ophthalmology;  Laterality: Bilateral;   REPAIR NONUNION / DEFECT OF RADIUS / ULNA     Repair Right   Social History   Tobacco Use   Smoking status: Former   Smokeless tobacco: Never   Tobacco comments:  smoked for several years after high school  Vaping Use   Vaping status: Never Used  Substance Use Topics   Alcohol use: Yes    Alcohol/week: 2.0 - 3.0 standard drinks of alcohol    Types: 2 - 3 Glasses of wine per week   Drug use: No   Family History  Problem Relation Age of Onset   Heart disease Mother        MI age 6's   Parkinsonism Mother        ?   Cancer Brother        small cell lung cancer   Mental illness Other        Bipolar   Cancer Maternal Aunt        breast   Breast cancer Maternal Aunt    Cancer Maternal Grandmother        breast cancer   Breast cancer Maternal Grandmother    Breast cancer Maternal Aunt    Allergies  Allergen Reactions   Sulfonamide Derivatives Rash    REACTION: stomach pain   Current Outpatient Medications on File Prior to Visit  Medication Sig Dispense  Refill   alendronate (FOSAMAX) 70 MG tablet Take 1 tablet (70 mg total) by mouth every 7 (seven) days. Take with a full glass of water on an empty stomach. 4 tablet 11   atorvastatin (LIPITOR) 40 MG tablet Take 1 tablet (40 mg total) by mouth daily. 90 tablet 3   Calcium Citrate-Vitamin D (CALCIUM + D PO) Take 1 capsule by mouth 2 (two) times daily.     cholecalciferol (VITAMIN D3) 25 MCG (1000 UT) tablet Take 1,000 Units by mouth daily.     diphenhydramine-acetaminophen (TYLENOL PM) 25-500 MG TABS tablet Take 1 tablet by mouth at bedtime as needed.     levothyroxine (SYNTHROID) 137 MCG tablet Take 1 tablet by mouth once daily 90 tablet 1   Omega-3 Fatty Acids (FISH OIL PO) Take by mouth daily.     traMADol (ULTRAM) 50 MG tablet TAKE 1 TABLET BY MOUTH EVERY 12 HOURS AS NEEDED 60 tablet 0   No current facility-administered medications on file prior to visit.    Review of Systems  Constitutional:  Positive for fatigue. Negative for activity change, appetite change and fever.  HENT:  Negative for congestion and sore throat.   Eyes:  Negative for itching and visual disturbance.  Respiratory:  Negative for cough and shortness of breath.   Cardiovascular:  Negative for leg swelling.  Gastrointestinal:  Negative for abdominal distention, abdominal pain, constipation, diarrhea and nausea.  Endocrine: Negative for cold intolerance and polydipsia.  Genitourinary:  Positive for dysuria, frequency and urgency. Negative for difficulty urinating, flank pain, hematuria and vaginal discharge.  Musculoskeletal:  Negative for myalgias.  Skin:  Negative for rash.  Allergic/Immunologic: Negative for immunocompromised state.  Neurological:  Negative for dizziness and weakness.  Hematological:  Negative for adenopathy.       Objective:   Physical Exam Constitutional:      General: She is not in acute distress.    Appearance: She is well-developed.  HENT:     Head: Normocephalic and atraumatic.  Eyes:      Conjunctiva/sclera: Conjunctivae normal.     Pupils: Pupils are equal, round, and reactive to light.  Cardiovascular:     Rate and Rhythm: Normal rate and regular rhythm.     Heart sounds: Normal heart sounds.  Pulmonary:     Effort: Pulmonary effort is normal.  Breath sounds: Normal breath sounds.  Abdominal:     General: Bowel sounds are normal. There is no distension.     Palpations: Abdomen is soft.     Tenderness: There is abdominal tenderness. There is no rebound.     Comments: No cva tenderness  Mild suprapubic tenderness  No bladder distension   Musculoskeletal:     Cervical back: Normal range of motion and neck supple.  Lymphadenopathy:     Cervical: No cervical adenopathy.  Skin:    Findings: No rash.  Neurological:     Mental Status: She is alert.  Psychiatric:        Mood and Affect: Mood normal.           Assessment & Plan:   Problem List Items Addressed This Visit       Genitourinary   UTI (urinary tract infection)   Several days Uncomplicated  Positive urinalysis and voiding symptoms   Prescription keflex 500 mg bid  Culture pending  Instructed to call if symptoms worsen in meantime  Call back and Er precautions noted in detail today   Handout give       Relevant Medications   cephALEXin (KEFLEX) 500 MG capsule   Other Relevant Orders   Urine Culture   Other Visit Diagnoses       Dysuria    -  Primary   Relevant Orders   POCT Urinalysis Dipstick (Automated) (Completed)

## 2023-06-20 ENCOUNTER — Encounter: Payer: Self-pay | Admitting: Family Medicine

## 2023-06-20 LAB — URINE CULTURE
MICRO NUMBER:: 16268129
SPECIMEN QUALITY:: ADEQUATE

## 2023-06-21 ENCOUNTER — Telehealth: Payer: Self-pay | Admitting: *Deleted

## 2023-06-21 NOTE — Telephone Encounter (Signed)
 Copied from CRM [id number]. Topic: General - Call Back - No Documentation >> Jun 21, 2023  1:50 PM Pascal Lux wrote: Reason for CRM: Patient returning call from office and said she picked up the medication and is feeling so much better, she is able to use the bathroom now.

## 2023-06-21 NOTE — Telephone Encounter (Signed)
 Aware.

## 2023-07-02 ENCOUNTER — Other Ambulatory Visit: Payer: Self-pay | Admitting: Family Medicine

## 2023-08-06 ENCOUNTER — Ambulatory Visit: Payer: Self-pay

## 2023-08-06 NOTE — Telephone Encounter (Signed)
 Copied from CRM [id number]. Topic: General - Call Back - No Documentation >> Aug 06, 2023  9:44 AM Shereese L wrote: Reason for CRM: in the she's having sickness due to the hernia which is causing her to loose weight. Patient needs a call back to go over what can be done

## 2023-08-06 NOTE — Telephone Encounter (Signed)
 Called and spoke to patient states it has been going on for only about 24 hours. She is starting to feel a little better but still not feeling better. Reviewed bland diet with patient and how to introduce fluids at small amounts. She is feeling weak so I have reviewed precautions of getting up and holding on to something. She denies any dryness in mouth and eyes at this time. Reviewed with her the concerns for dehydration and informed the only way to get fluids iv would be in ED. She has agreed to try to follow bland diet and small frequent clear fluids no red, pink or purple. If any more vomiting she will go to ED for evaluation. If not and she is still feeling fatigue or nausea she will call office for visit.  Have also reviewed all red words that would recommend go to ED right away or call 911.

## 2023-08-06 NOTE — Telephone Encounter (Signed)
 Second attempt - LVM to retrun call.

## 2023-08-06 NOTE — Telephone Encounter (Signed)
 That seems reasonable to me.

## 2023-08-06 NOTE — Telephone Encounter (Signed)
 Copied from CRM [id number]. Topic: General - Call Back - No Documentation >> Aug 06, 2023  9:44 AM Shereese L wrote: Reason for CRM: in the she's having sickness due to the hernia which is causing her to loose weight. Patient needs a call back to go over what can be done   Chief Complaint: Vomiting Symptoms: nausea and vomiting Frequency: intermittent Pertinent Negatives: Patient denies diarrhea Disposition: [] ED /[] Urgent Care (no appt availability in office) / [] Appointment(In office/virtual)/ []  [facility]/ [] Home Care/ [] Refused Recommended Disposition /[] [facility]/ []  Follow-up with PCP Additional Notes: No avail appts today, RN advising ED however Pt sts that she prefers not to. Pt says she feels too sick to wait in the ED. Will route HP to clinic for f/u and scheduling Reason for Disposition  [1] MODERATE vomiting (e.g., 3 - 5 times/day) AND [2] age > 60 years  Answer Assessment - Initial Assessment Questions 1. VOMITING SEVERITY: "How many times have you vomited in the past 24 hours?"     - MILD:  1 - 2 times/day    - MODERATE: 3 - 5 times/day, decreased oral intake without significant weight loss or symptoms of dehydration    - SEVERE: 6 or more times/day, vomits everything or nearly everything, with significant weight loss, symptoms of dehydration      4 times, but now dry heaving  2. ONSET: "When did the vomiting begin?"      Yesterday  3. FLUIDS: "What fluids or food have you vomited up today?" "Have you been able to keep any fluids down?"     Able to tolerate, hasn't eaten since yesterday  4. ABDOMEN PAIN: "Are your having any abdomen pain?" If Yes : "How bad is it and what does it feel like?" (e.g., crampy, dull, intermittent, constant)      No abdominal pain  5. DIARRHEA: "Is there any diarrhea?" If Yes, ask: "How many times today?"      No diarrhea  6. CONTACTS: "Is there anyone else in the family with the same symptoms?"      No  7. CAUSE:  "What do you think is causing your vomiting?"     Conerned that a hiatal hernia could be cause  8. HYDRATION STATUS: "Any signs of dehydration?" (e.g., dry mouth [not only dry lips], too weak to stand) "When did you last urinate?"     Not dehydrated, urinating okay  9. OTHER SYMPTOMS: "Do you have any other symptoms?" (e.g., fever, headache, vertigo, vomiting blood or coffee grounds, recent head injury)     No  10. PREGNANCY: "Is there any chance you are pregnant?" "When was your last menstrual period?"       No  Protocols used: Vomiting-A-AH

## 2023-08-07 NOTE — Telephone Encounter (Signed)
 Noted

## 2023-08-11 ENCOUNTER — Other Ambulatory Visit: Payer: Self-pay | Admitting: Family Medicine

## 2023-08-11 DIAGNOSIS — M545 Low back pain, unspecified: Secondary | ICD-10-CM

## 2023-08-14 NOTE — Telephone Encounter (Signed)
 Last office visit 06/18/2023 with Dr. Malissa Se for dysuria.  Last refilled 04/24/2023 for #60 with no refills.  Next Appt: No future appointments.

## 2023-09-27 ENCOUNTER — Other Ambulatory Visit: Payer: Self-pay | Admitting: Family Medicine

## 2023-09-27 NOTE — Telephone Encounter (Signed)
 LVM to schedule CPE 01/11/24 or after with fasting labs one week prior.

## 2023-09-27 NOTE — Telephone Encounter (Signed)
 Please schedule Medicare Wellness with Erminio and CPE with fasting labs prior for Dr. Avelina after 01/11/2024.

## 2023-10-17 ENCOUNTER — Other Ambulatory Visit: Payer: Self-pay | Admitting: Family Medicine

## 2023-10-17 DIAGNOSIS — G8929 Other chronic pain: Secondary | ICD-10-CM

## 2023-10-17 NOTE — Telephone Encounter (Signed)
 Last office visit 06/18/2023 with Dr. Randeen for dysuria.  Last refilled 08/14/2023 for #60 with no refills.  Next appt: CPE 01/15/2024.

## 2023-10-19 DIAGNOSIS — S52532A Colles' fracture of left radius, initial encounter for closed fracture: Secondary | ICD-10-CM | POA: Diagnosis not present

## 2023-10-25 DIAGNOSIS — M84339A Stress fracture, unspecified ulna and radius, initial encounter for fracture: Secondary | ICD-10-CM | POA: Insufficient documentation

## 2023-11-01 ENCOUNTER — Other Ambulatory Visit: Payer: Self-pay | Admitting: Family Medicine

## 2023-11-01 DIAGNOSIS — S52532A Colles' fracture of left radius, initial encounter for closed fracture: Secondary | ICD-10-CM | POA: Diagnosis not present

## 2023-11-07 ENCOUNTER — Other Ambulatory Visit: Payer: Self-pay | Admitting: Family Medicine

## 2023-11-19 ENCOUNTER — Other Ambulatory Visit: Payer: Self-pay | Admitting: Family Medicine

## 2023-11-22 ENCOUNTER — Ambulatory Visit: Payer: Self-pay

## 2023-11-22 NOTE — Telephone Encounter (Signed)
 FYI Only or Action Required?: FYI only for provider.  Patient was last seen in primary care on 06/18/2023 by Randeen Laine LABOR, MD.  Called Nurse Triage reporting No chief complaint on file..  Symptoms began yesterday.  Interventions attempted: Nothing.  Symptoms are: unchanged.  Triage Disposition: See Physician Within 24 Hours  Patient/caregiver understands and will follow disposition?: Yes          Copied from CRM #[id number]. Topic: Clinical - Red Word Triage >> Nov 22, 2023  4:23 PM Rea ORN wrote: Red Word that prompted transfer to Nurse Triage: possible UTI, not fully urinating, pain in arms and legs, pressure with urinating          Reason for Disposition  Bad or foul-smelling urine  Answer Assessment - Initial Assessment Questions 1. SYMPTOM: What's the main symptom you're concerned about? (e.g., frequency, incontinence)     Decreased urinary output  2. ONSET: When did the decreased output start?     Last night  3. PAIN: Is there any pain? If Yes, ask: How bad is it? (Scale: 1-10; mild, moderate, severe)     Moderate  4. CAUSE: What do you think is causing the symptoms?     Possible UTI  5. OTHER SYMPTOMS: Do you have any other symptoms? (e.g., blood in urine, fever, flank pain, pain with urination)     Pain in arm and legs when urinating  Protocols used: Urinary Symptoms-A-AH

## 2023-11-22 NOTE — Telephone Encounter (Signed)
 Appointment with Stephanie Kemp 11/23/23.

## 2023-11-23 ENCOUNTER — Telehealth: Payer: Self-pay

## 2023-11-23 ENCOUNTER — Ambulatory Visit: Payer: Self-pay | Admitting: General Practice

## 2023-11-23 ENCOUNTER — Encounter: Payer: Self-pay | Admitting: General Practice

## 2023-11-23 ENCOUNTER — Ambulatory Visit (INDEPENDENT_AMBULATORY_CARE_PROVIDER_SITE_OTHER): Admitting: General Practice

## 2023-11-23 VITALS — BP 136/84 | HR 103 | Temp 97.7°F | Ht 64.7 in | Wt 157.8 lb

## 2023-11-23 DIAGNOSIS — R300 Dysuria: Secondary | ICD-10-CM | POA: Diagnosis not present

## 2023-11-23 LAB — POC URINALSYSI DIPSTICK (AUTOMATED)
Bilirubin, UA: NEGATIVE
Blood, UA: NEGATIVE
Glucose, UA: NEGATIVE
Ketones, UA: NEGATIVE
Nitrite, UA: NEGATIVE
Protein, UA: POSITIVE — AB
Spec Grav, UA: 1.02 (ref 1.010–1.025)
Urobilinogen, UA: 0.2 U/dL
pH, UA: 6 (ref 5.0–8.0)

## 2023-11-23 MED ORDER — NITROFURANTOIN MONOHYD MACRO 100 MG PO CAPS: 100.0000 mg | ORAL_CAPSULE | Freq: Two times a day (BID) | ORAL | 0 refills | Status: AC

## 2023-11-23 NOTE — Progress Notes (Signed)
 Established Patient Office Visit  Subjective   Patient ID: Stephanie Kemp, female    DOB: 1945-03-24  Age: 78 y.o. MRN: [id number]  Chief Complaint  Patient presents with   Dysuria    Patient having burning with urination, frequency and barely little urine coming out x 2 days    Dysuria  Associated symptoms include frequency and urgency. Pertinent negatives include no chills, nausea or vomiting.    Discussed the use of AI scribe software for clinical note transcription with the patient, who gave verbal consent to proceed.  History of Present Illness Stephanie Kemp is a 78 year old female, patient of Dr. Avelina, who presents with urinary urgency and frequency.  She has been experiencing urinary urgency and frequency for the past two days, with only a few drops of urine voided upon reaching the bathroom. The sensation is uncomfortable and localized to the lower abdomen and urinary tract. No fever, chills, nausea, vomiting, hematuria, or flank pain.  She has a history of urinary tract infections, frequently occurring when she was younger, and the current symptoms feel similar to those past infections.   Approximately five weeks ago, she experienced a fall on her left side, which was managed by orthopedics. She has been experiencing balance issues and is walking carefully to avoid further falls.  Patient Active Problem List   Diagnosis Date Noted   Stress fracture of ulna 10/25/2023   UTI (urinary tract infection) 06/18/2023   Hiatal hernia 01/11/2023   Aortic atherosclerosis (HCC) 09/17/2018   Mixed incontinence 05/05/2016   Advanced directives, counseling/discussion 03/30/2015   Chronic low back pain 03/30/2015   Vitamin D  deficiency 02/18/2014   High cholesterol 07/11/2010   Hypothyroidism 04/23/2009   OSTEOARTHRITIS, MODERATE 04/23/2009   Osteoporosis 04/23/2009   Past Medical History:  Diagnosis Date   Arthritis    lower back, left hip   Degenerative disc disease,  lumbar    Headache    migraines.  none in several yrs   Hypothyroidism    Pericardial effusion    approx 2010. Unknown etiology. resolved.   Refusal of blood transfusions as patient is Jehovah's Witness    S/P dilatation and curettage    Past Surgical History:  Procedure Laterality Date   BROW LIFT Bilateral 11/23/2015   Procedure: BLEPHAROPLASTY, upper;  Surgeon: Greig CHRISTELLA Gay, MD;  Location: [facility];  Service: Ophthalmology;  Laterality: Bilateral;   DENTAL SURGERY  04/2018   DILATION AND CURETTAGE OF UTERUS     ECTROPION REPAIR Bilateral 11/23/2015   Procedure: REPAIR OF ECTROPION, lower;  Surgeon: Greig CHRISTELLA Gay, MD;  Location: [facility];  Service: Ophthalmology;  Laterality: Bilateral;   PTOSIS REPAIR Bilateral 11/23/2015   Procedure: PTOSIS REPAIR, upper;  Surgeon: Greig CHRISTELLA Gay, MD;  Location: [facility];  Service: Ophthalmology;  Laterality: Bilateral;   REPAIR NONUNION / DEFECT OF RADIUS / ULNA     Repair Right   Allergies  Allergen Reactions   Sulfonamide Derivatives Rash    REACTION: stomach pain         11/23/2023   12:00 PM 01/11/2023    3:33 PM 12/01/2021    2:24 PM  Depression screen PHQ 2/9  Decreased Interest 0 0 0  Down, Depressed, Hopeless 0 0 0  PHQ - 2 Score 0 0 0  Altered sleeping 1    Tired, decreased energy 1    Change in appetite 0    Feeling bad or failure about yourself  0    Trouble concentrating 0    Moving slowly or fidgety/restless 0    Suicidal thoughts 0    PHQ-9 Score 2    Difficult doing work/chores Not difficult at all         11/23/2023   12:00 PM  GAD 7 : Generalized Anxiety Score  Nervous, Anxious, on Edge 0  Control/stop worrying 0  Worry too much - different things 0  Trouble relaxing 0  Restless 0  Easily annoyed or irritable 0  Afraid - awful might happen 0  Total GAD 7 Score 0  Anxiety Difficulty Not difficult at all      Review of Systems  Constitutional:  Negative for chills and fever.   Respiratory:  Negative for shortness of breath.   Cardiovascular:  Negative for chest pain.  Gastrointestinal:  Negative for abdominal pain, constipation, diarrhea, heartburn, nausea and vomiting.  Genitourinary:  Positive for dysuria, frequency and urgency.  Neurological:  Negative for dizziness and headaches.  Endo/Heme/Allergies:  Negative for polydipsia.  Psychiatric/Behavioral:  Negative for depression and suicidal ideas. The patient is not nervous/anxious.       Objective:     BP 136/84   Pulse (!) 103   Temp 97.7 F (36.5 C) (Oral)   Ht 5' 4.7 (1.643 m)   Wt 157 lb 12.8 oz (71.6 kg)   SpO2 96%   BMI 26.50 kg/m  BP Readings from Last 3 Encounters:  11/23/23 136/84  06/18/23 121/80  01/11/23 110/80   Wt Readings from Last 3 Encounters:  11/23/23 157 lb 12.8 oz (71.6 kg)  06/18/23 161 lb (73 kg)  01/11/23 163 lb 6 oz (74.1 kg)      Physical Exam Vitals and nursing note reviewed.  Constitutional:      Appearance: Normal appearance.  Cardiovascular:     Rate and Rhythm: Normal rate and regular rhythm.     Pulses: Normal pulses.     Heart sounds: Normal heart sounds.  Pulmonary:     Effort: Pulmonary effort is normal.     Breath sounds: Normal breath sounds.  Abdominal:     General: Bowel sounds are normal.     Tenderness: There is no right CVA tenderness or left CVA tenderness.  Neurological:     Mental Status: She is alert and oriented to person, place, and time.  Psychiatric:        Mood and Affect: Mood normal.        Behavior: Behavior normal.        Thought Content: Thought content normal.        Judgment: Judgment normal.      Results for orders placed or performed in visit on 11/23/23  POCT Urinalysis Dipstick (Automated)  Result Value Ref Range   Color, UA yellow    Clarity, UA cloudy    Glucose, UA Negative Negative   Bilirubin, UA neg    Ketones, UA neg    Spec Grav, UA 1.020 1.010 - 1.025   Blood, UA neg    pH, UA 6.0 5.0 - 8.0    Protein, UA Positive (A) Negative   Urobilinogen, UA 0.2 0.2 or 1.0 E.U./dL   Nitrite, UA neg    Leukocytes, UA Moderate (2+) (A) Negative       The 10-year ASCVD risk score (Arnett DK, et al., 2019) is: 20%    Assessment & Plan:  Dysuria -     POCT Urinalysis Dipstick (Automated) -     Urine  Culture -     CBC with Differential/Platelet -     Basic metabolic panel with GFR -     Nitrofurantoin  Monohyd Macro; Take 1 capsule (100 mg total) by mouth 2 (two) times daily for 5 days.  Dispense: 10 capsule; Refill: 0    Assessment and Plan Assessment & Plan Urinary tract infection Symptoms and urinalysis suggest UTI.  - POC UA shows 2+ leuks, negative for nitrites.  - given symptoms and presentation, will treat.  - urine culture pending. -Start Macrobid  (nitrofurantoin ) tablets for urinary tract infection. Take 1 tablet by mouth twice daily for 5 days. - CBC with diff and BMP pending. - Advised increased fluid intake, rest, and avoidance of spicy foods and caffeine. - ER precautions provided.  Return if symptoms worsen or fail to improve.    Carrol Aurora, NP

## 2023-11-23 NOTE — Addendum Note (Signed)
 Addended by: ISADORA RAISIN on: 11/23/2023 01:41 PM   Modules accepted: Orders

## 2023-11-23 NOTE — Telephone Encounter (Signed)
 Patient was seen in the office today and was supposed to have labs done. Patient left before having labs done and needs to return to have them drawn. Called patient but no answer and LMTCB.

## 2023-11-23 NOTE — Patient Instructions (Addendum)
 Stop by the lab prior to leaving today. I will notify you of your results once received.   Start Macrobid  (nitrofurantoin ) tablets for urinary tract infection. Take 1 tablet by mouth twice daily for 5 days.  Please make sure you are drinking plenty of water, rest.  Avoid spicy foods and caffeine intake for the next few days.   If your symptoms worsen or fail to improve.   It was a pleasure to see you today!

## 2023-11-25 LAB — URINE CULTURE
MICRO NUMBER:: 16929079
SPECIMEN QUALITY:: ADEQUATE

## 2023-11-26 NOTE — Telephone Encounter (Signed)
 LMTCB

## 2023-11-29 ENCOUNTER — Other Ambulatory Visit

## 2023-11-29 DIAGNOSIS — S52692D Other fracture of lower end of left ulna, subsequent encounter for closed fracture with routine healing: Secondary | ICD-10-CM | POA: Diagnosis not present

## 2023-11-29 NOTE — Telephone Encounter (Signed)
 Spoke with patient and advised of needed labs from her visit with [facility]. Patient apologized for missing them and will return this afternoon to have them done.

## 2023-11-30 ENCOUNTER — Other Ambulatory Visit (INDEPENDENT_AMBULATORY_CARE_PROVIDER_SITE_OTHER)

## 2023-11-30 DIAGNOSIS — R300 Dysuria: Secondary | ICD-10-CM | POA: Diagnosis not present

## 2023-11-30 LAB — BASIC METABOLIC PANEL WITH GFR
BUN: 16 mg/dL (ref 6–23)
CO2: 31 meq/L (ref 19–32)
Calcium: 9.4 mg/dL (ref 8.4–10.5)
Chloride: 100 meq/L (ref 96–112)
Creatinine, Ser: 0.89 mg/dL (ref 0.40–1.20)
GFR: 62.89 mL/min (ref >60.00)
Glucose, Bld: 74 mg/dL (ref 70–99)
Potassium: 3.4 meq/L — ABNORMAL LOW (ref 3.5–5.1)
Sodium: 138 meq/L (ref 135–145)

## 2023-11-30 LAB — CBC WITH DIFFERENTIAL/PLATELET
Basophils Absolute: 0.1 K/uL (ref 0.0–0.1)
Basophils Relative: 0.9 % (ref 0.0–3.0)
Eosinophils Absolute: 0.1 K/uL (ref 0.0–0.7)
Eosinophils Relative: 1.3 % (ref 0.0–5.0)
HCT: 40.3 % (ref 36.0–46.0)
Hemoglobin: 13.1 g/dL (ref 12.0–15.0)
Lymphocytes Relative: 20.5 % (ref 12.0–46.0)
Lymphs Abs: 1.8 K/uL (ref 0.7–4.0)
MCHC: 32.6 g/dL (ref 30.0–36.0)
MCV: 87.9 fl (ref 78.0–100.0)
Monocytes Absolute: 0.5 K/uL (ref 0.1–1.0)
Monocytes Relative: 6.3 % (ref 3.0–12.0)
Neutro Abs: 6.1 K/uL (ref 1.4–7.7)
Neutrophils Relative %: 71 % (ref 43.0–77.0)
Platelets: 325 K/uL (ref 150.0–400.0)
RBC: 4.58 Mil/uL (ref 3.87–5.11)
RDW: 14.2 % (ref 11.5–15.5)
WBC: 8.6 K/uL (ref 4.0–10.5)

## 2023-12-01 ENCOUNTER — Other Ambulatory Visit: Payer: Self-pay | Admitting: Family Medicine

## 2023-12-04 ENCOUNTER — Other Ambulatory Visit: Payer: Self-pay | Admitting: Family Medicine

## 2023-12-04 DIAGNOSIS — S52502D Unspecified fracture of the lower end of left radius, subsequent encounter for closed fracture with routine healing: Secondary | ICD-10-CM | POA: Diagnosis not present

## 2023-12-08 ENCOUNTER — Other Ambulatory Visit: Payer: Self-pay | Admitting: Family Medicine

## 2023-12-11 ENCOUNTER — Telehealth: Payer: Self-pay

## 2023-12-11 NOTE — Telephone Encounter (Signed)
 Copied from CRM #[id number]. Topic: Clinical - Lab/Test Results >> Dec 10, 2023  5:45 PM Shereese L wrote: Reason for CRM: patient is requesting labs for her liver and whatever other test that was taken that day and would like a call back

## 2023-12-12 NOTE — Telephone Encounter (Signed)
 Called patient. Patient was calling back about her lab results. Results given to patient. Nothing else needed.

## 2023-12-14 ENCOUNTER — Other Ambulatory Visit: Payer: Self-pay | Admitting: Family Medicine

## 2023-12-15 ENCOUNTER — Other Ambulatory Visit: Payer: Self-pay | Admitting: Family Medicine

## 2023-12-17 DIAGNOSIS — S52502D Unspecified fracture of the lower end of left radius, subsequent encounter for closed fracture with routine healing: Secondary | ICD-10-CM | POA: Diagnosis not present

## 2023-12-21 ENCOUNTER — Telehealth: Payer: Self-pay | Admitting: *Deleted

## 2023-12-21 ENCOUNTER — Ambulatory Visit

## 2023-12-21 DIAGNOSIS — E7800 Pure hypercholesterolemia, unspecified: Secondary | ICD-10-CM

## 2023-12-21 DIAGNOSIS — E559 Vitamin D deficiency, unspecified: Secondary | ICD-10-CM

## 2023-12-21 DIAGNOSIS — E038 Other specified hypothyroidism: Secondary | ICD-10-CM

## 2023-12-21 NOTE — Telephone Encounter (Signed)
-----   Message from Harlene Du sent at 12/21/2023  2:17 PM EDT ----- Regarding: Lab Tues 01/08/24 Hello,  Patient is coming in for CPE labs on Tuesday 01/08/24. Can we get orders please.   Thanks

## 2023-12-29 ENCOUNTER — Other Ambulatory Visit: Payer: Self-pay | Admitting: Family Medicine

## 2024-01-08 ENCOUNTER — Ambulatory Visit: Payer: Self-pay | Admitting: Family Medicine

## 2024-01-08 ENCOUNTER — Other Ambulatory Visit

## 2024-01-08 DIAGNOSIS — E7800 Pure hypercholesterolemia, unspecified: Secondary | ICD-10-CM | POA: Diagnosis not present

## 2024-01-08 DIAGNOSIS — E559 Vitamin D deficiency, unspecified: Secondary | ICD-10-CM | POA: Diagnosis not present

## 2024-01-08 DIAGNOSIS — E038 Other specified hypothyroidism: Secondary | ICD-10-CM | POA: Diagnosis not present

## 2024-01-08 LAB — LIPID PANEL
Cholesterol: 244 mg/dL — ABNORMAL HIGH (ref 0–200)
HDL: 53.4 mg/dL (ref >39.00)
LDL Cholesterol: 163 mg/dL — ABNORMAL HIGH (ref 0–99)
NonHDL: 190.56
Total CHOL/HDL Ratio: 5
Triglycerides: 136 mg/dL (ref 0.0–149.0)
VLDL: 27.2 mg/dL (ref 0.0–40.0)

## 2024-01-08 LAB — COMPREHENSIVE METABOLIC PANEL WITH GFR
ALT: 15 U/L (ref 0–35)
AST: 19 U/L (ref 0–37)
Albumin: 3.6 g/dL (ref 3.5–5.2)
Alkaline Phosphatase: 54 U/L (ref 39–117)
BUN: 19 mg/dL (ref 6–23)
CO2: 29 meq/L (ref 19–32)
Calcium: 8.5 mg/dL (ref 8.4–10.5)
Chloride: 104 meq/L (ref 96–112)
Creatinine, Ser: 0.94 mg/dL (ref 0.40–1.20)
GFR: 58.85 mL/min — ABNORMAL LOW (ref >60.00)
Glucose, Bld: 86 mg/dL (ref 70–99)
Potassium: 3.8 meq/L (ref 3.5–5.1)
Sodium: 142 meq/L (ref 135–145)
Total Bilirubin: 0.5 mg/dL (ref 0.2–1.2)
Total Protein: 5.6 g/dL — ABNORMAL LOW (ref 6.0–8.3)

## 2024-01-08 LAB — T3, FREE: T3, Free: 2.7 pg/mL (ref 2.3–4.2)

## 2024-01-08 LAB — VITAMIN D 25 HYDROXY (VIT D DEFICIENCY, FRACTURES): VITD: 19.3 ng/mL — ABNORMAL LOW (ref 30.00–100.00)

## 2024-01-08 LAB — TSH: TSH: 1.92 u[IU]/mL (ref 0.35–5.50)

## 2024-01-08 LAB — T4, FREE: Free T4: 1.02 ng/dL (ref 0.60–1.60)

## 2024-01-08 NOTE — Progress Notes (Signed)
 No critical labs need to be addressed urgently. We will discuss labs in detail at upcoming office visit.

## 2024-01-15 ENCOUNTER — Encounter: Admitting: Family Medicine

## 2024-01-19 ENCOUNTER — Other Ambulatory Visit: Payer: Self-pay | Admitting: Family Medicine

## 2024-01-19 DIAGNOSIS — G8929 Other chronic pain: Secondary | ICD-10-CM

## 2024-01-22 NOTE — Telephone Encounter (Signed)
 Reached out to patient to make appt. No answer. Left VM.

## 2024-01-31 ENCOUNTER — Encounter: Admitting: Family Medicine

## 2024-04-11 ENCOUNTER — Other Ambulatory Visit: Payer: Self-pay | Admitting: Family Medicine

## 2024-04-11 DIAGNOSIS — G8929 Other chronic pain: Secondary | ICD-10-CM

## 2024-04-11 NOTE — Telephone Encounter (Signed)
 Last office visit 11/23/23 with Carrol for Dysuria.  Last refilled 01/22/24 for #60 with no refills. Next appt: No future appointments.   Please schedule Medicare Wellness with Erminio and physical with Dr. Avelina.

## 2024-04-23 NOTE — Telephone Encounter (Signed)
 Called and schedule pt for Cpe / labs

## 2024-04-24 ENCOUNTER — Telehealth: Payer: Self-pay | Admitting: *Deleted

## 2024-04-24 ENCOUNTER — Other Ambulatory Visit: Payer: Self-pay | Admitting: Family Medicine

## 2024-04-24 DIAGNOSIS — E7800 Pure hypercholesterolemia, unspecified: Secondary | ICD-10-CM

## 2024-04-24 NOTE — Telephone Encounter (Signed)
-----   Message from Veva JINNY Ferrari sent at 04/24/2024  3:11 PM EST ----- Regarding: Lab orders for [facility], 2.11.26 Patient is scheduled for CPX labs, please order future labs, Thanks , Veva

## 2024-04-30 ENCOUNTER — Other Ambulatory Visit

## 2024-05-07 ENCOUNTER — Encounter: Admitting: Family Medicine
